# Patient Record
Sex: Female | Born: 1975 | Race: Black or African American | Hispanic: No | Marital: Single | Smoking: Never smoker
Health system: Southern US, Community
[De-identification: ages and names within clinical notes are randomized; demographics above are authoritative.]

## PROBLEM LIST (undated history)

## (undated) ENCOUNTER — Inpatient Hospital Stay (HOSPITAL_COMMUNITY): Payer: Self-pay

## (undated) DIAGNOSIS — N200 Calculus of kidney: Secondary | ICD-10-CM

## (undated) DIAGNOSIS — IMO0002 Reserved for concepts with insufficient information to code with codable children: Secondary | ICD-10-CM

## (undated) DIAGNOSIS — D649 Anemia, unspecified: Secondary | ICD-10-CM

## (undated) DIAGNOSIS — B009 Herpesviral infection, unspecified: Secondary | ICD-10-CM

## (undated) DIAGNOSIS — R87619 Unspecified abnormal cytological findings in specimens from cervix uteri: Secondary | ICD-10-CM

## (undated) DIAGNOSIS — D219 Benign neoplasm of connective and other soft tissue, unspecified: Secondary | ICD-10-CM

## (undated) DIAGNOSIS — N189 Chronic kidney disease, unspecified: Secondary | ICD-10-CM

## (undated) HISTORY — DX: Benign neoplasm of connective and other soft tissue, unspecified: D21.9

## (undated) HISTORY — PX: LAPAROSCOPY: SHX197

## (undated) HISTORY — PX: CHOLECYSTECTOMY: SHX55

## (undated) HISTORY — PX: LEEP: SHX91

## (undated) HISTORY — DX: Herpesviral infection, unspecified: B00.9

---

## 1998-11-25 ENCOUNTER — Other Ambulatory Visit: Admission: RE | Admit: 1998-11-25 | Discharge: 1998-11-25 | Payer: Self-pay | Admitting: Obstetrics and Gynecology

## 1999-05-01 ENCOUNTER — Inpatient Hospital Stay (HOSPITAL_COMMUNITY): Admission: AD | Admit: 1999-05-01 | Discharge: 1999-05-01 | Payer: Self-pay | Admitting: Obstetrics and Gynecology

## 1999-10-25 ENCOUNTER — Inpatient Hospital Stay (HOSPITAL_COMMUNITY): Admission: AD | Admit: 1999-10-25 | Discharge: 1999-10-25 | Payer: Self-pay | Admitting: Obstetrics and Gynecology

## 1999-11-11 ENCOUNTER — Other Ambulatory Visit: Admission: RE | Admit: 1999-11-11 | Discharge: 1999-11-11 | Payer: Self-pay | Admitting: Obstetrics and Gynecology

## 1999-12-30 ENCOUNTER — Other Ambulatory Visit: Admission: RE | Admit: 1999-12-30 | Discharge: 1999-12-30 | Payer: Self-pay | Admitting: Obstetrics and Gynecology

## 1999-12-30 ENCOUNTER — Encounter (INDEPENDENT_AMBULATORY_CARE_PROVIDER_SITE_OTHER): Payer: Self-pay

## 2000-03-21 ENCOUNTER — Inpatient Hospital Stay (HOSPITAL_COMMUNITY): Admission: AD | Admit: 2000-03-21 | Discharge: 2000-03-21 | Payer: Self-pay | Admitting: Obstetrics and Gynecology

## 2000-04-02 ENCOUNTER — Other Ambulatory Visit: Admission: RE | Admit: 2000-04-02 | Discharge: 2000-04-02 | Payer: Self-pay | Admitting: Obstetrics and Gynecology

## 2000-05-08 ENCOUNTER — Inpatient Hospital Stay (HOSPITAL_COMMUNITY): Admission: AD | Admit: 2000-05-08 | Discharge: 2000-05-08 | Payer: Self-pay | Admitting: Obstetrics & Gynecology

## 2000-09-11 ENCOUNTER — Other Ambulatory Visit: Admission: RE | Admit: 2000-09-11 | Discharge: 2000-09-11 | Payer: Self-pay | Admitting: Obstetrics and Gynecology

## 2000-10-17 ENCOUNTER — Other Ambulatory Visit: Admission: RE | Admit: 2000-10-17 | Discharge: 2000-10-17 | Payer: Self-pay | Admitting: Obstetrics and Gynecology

## 2000-11-16 ENCOUNTER — Ambulatory Visit (HOSPITAL_COMMUNITY): Admission: RE | Admit: 2000-11-16 | Discharge: 2000-11-16 | Payer: Self-pay | Admitting: Obstetrics and Gynecology

## 2000-11-16 ENCOUNTER — Encounter (INDEPENDENT_AMBULATORY_CARE_PROVIDER_SITE_OTHER): Payer: Self-pay | Admitting: *Deleted

## 2001-01-28 ENCOUNTER — Emergency Department (HOSPITAL_COMMUNITY): Admission: EM | Admit: 2001-01-28 | Discharge: 2001-01-29 | Payer: Self-pay | Admitting: *Deleted

## 2001-01-30 ENCOUNTER — Emergency Department (HOSPITAL_COMMUNITY): Admission: EM | Admit: 2001-01-30 | Discharge: 2001-01-30 | Payer: Self-pay | Admitting: Emergency Medicine

## 2001-01-30 ENCOUNTER — Encounter: Payer: Self-pay | Admitting: Emergency Medicine

## 2001-02-07 ENCOUNTER — Encounter: Payer: Self-pay | Admitting: Surgery

## 2001-02-07 ENCOUNTER — Ambulatory Visit (HOSPITAL_COMMUNITY): Admission: RE | Admit: 2001-02-07 | Discharge: 2001-02-07 | Payer: Self-pay | Admitting: Surgery

## 2001-03-14 ENCOUNTER — Other Ambulatory Visit: Admission: RE | Admit: 2001-03-14 | Discharge: 2001-03-14 | Payer: Self-pay | Admitting: Obstetrics and Gynecology

## 2001-04-29 ENCOUNTER — Inpatient Hospital Stay (HOSPITAL_COMMUNITY): Admission: AD | Admit: 2001-04-29 | Discharge: 2001-04-29 | Payer: Self-pay | Admitting: Obstetrics & Gynecology

## 2001-08-27 ENCOUNTER — Other Ambulatory Visit: Admission: RE | Admit: 2001-08-27 | Discharge: 2001-08-27 | Payer: Self-pay | Admitting: Obstetrics and Gynecology

## 2001-11-04 ENCOUNTER — Inpatient Hospital Stay (HOSPITAL_COMMUNITY): Admission: AD | Admit: 2001-11-04 | Discharge: 2001-11-04 | Payer: Self-pay | Admitting: Obstetrics and Gynecology

## 2002-02-06 HISTORY — PX: OTHER SURGICAL HISTORY: SHX169

## 2002-02-16 ENCOUNTER — Inpatient Hospital Stay (HOSPITAL_COMMUNITY): Admission: AD | Admit: 2002-02-16 | Discharge: 2002-02-16 | Payer: Self-pay | Admitting: Obstetrics and Gynecology

## 2002-03-26 ENCOUNTER — Inpatient Hospital Stay (HOSPITAL_COMMUNITY): Admission: AD | Admit: 2002-03-26 | Discharge: 2002-03-28 | Payer: Self-pay | Admitting: Obstetrics and Gynecology

## 2002-04-15 ENCOUNTER — Encounter: Payer: Self-pay | Admitting: Emergency Medicine

## 2002-04-16 ENCOUNTER — Encounter: Payer: Self-pay | Admitting: Surgery

## 2002-04-16 ENCOUNTER — Inpatient Hospital Stay (HOSPITAL_COMMUNITY): Admission: EM | Admit: 2002-04-16 | Discharge: 2002-04-19 | Payer: Self-pay | Admitting: Emergency Medicine

## 2002-04-16 ENCOUNTER — Encounter (INDEPENDENT_AMBULATORY_CARE_PROVIDER_SITE_OTHER): Payer: Self-pay | Admitting: Specialist

## 2002-04-17 ENCOUNTER — Encounter: Payer: Self-pay | Admitting: Gastroenterology

## 2002-04-23 ENCOUNTER — Other Ambulatory Visit: Admission: RE | Admit: 2002-04-23 | Discharge: 2002-04-23 | Payer: Self-pay | Admitting: Obstetrics and Gynecology

## 2002-07-12 ENCOUNTER — Inpatient Hospital Stay (HOSPITAL_COMMUNITY): Admission: AD | Admit: 2002-07-12 | Discharge: 2002-07-12 | Payer: Self-pay | Admitting: Obstetrics and Gynecology

## 2002-07-14 ENCOUNTER — Inpatient Hospital Stay (HOSPITAL_COMMUNITY): Admission: AD | Admit: 2002-07-14 | Discharge: 2002-07-14 | Payer: Self-pay | Admitting: Obstetrics & Gynecology

## 2002-07-15 ENCOUNTER — Observation Stay (HOSPITAL_COMMUNITY): Admission: EM | Admit: 2002-07-15 | Discharge: 2002-07-16 | Payer: Self-pay | Admitting: *Deleted

## 2002-07-15 ENCOUNTER — Encounter (INDEPENDENT_AMBULATORY_CARE_PROVIDER_SITE_OTHER): Payer: Self-pay | Admitting: Specialist

## 2003-04-29 ENCOUNTER — Other Ambulatory Visit: Admission: RE | Admit: 2003-04-29 | Discharge: 2003-04-29 | Payer: Self-pay | Admitting: Obstetrics and Gynecology

## 2003-05-08 ENCOUNTER — Ambulatory Visit (HOSPITAL_COMMUNITY): Admission: RE | Admit: 2003-05-08 | Discharge: 2003-05-08 | Payer: Self-pay | Admitting: Cardiology

## 2003-10-31 ENCOUNTER — Emergency Department (HOSPITAL_COMMUNITY): Admission: EM | Admit: 2003-10-31 | Discharge: 2003-10-31 | Payer: Self-pay | Admitting: Emergency Medicine

## 2004-05-23 ENCOUNTER — Other Ambulatory Visit: Admission: RE | Admit: 2004-05-23 | Discharge: 2004-05-23 | Payer: Self-pay | Admitting: Obstetrics and Gynecology

## 2005-07-19 ENCOUNTER — Emergency Department (HOSPITAL_COMMUNITY): Admission: EM | Admit: 2005-07-19 | Discharge: 2005-07-19 | Payer: Self-pay | Admitting: Family Medicine

## 2006-02-02 ENCOUNTER — Emergency Department (HOSPITAL_COMMUNITY): Admission: EM | Admit: 2006-02-02 | Discharge: 2006-02-02 | Payer: Self-pay | Admitting: Family Medicine

## 2006-04-28 ENCOUNTER — Emergency Department (HOSPITAL_COMMUNITY): Admission: EM | Admit: 2006-04-28 | Discharge: 2006-04-28 | Payer: Self-pay | Admitting: Family Medicine

## 2006-08-01 ENCOUNTER — Ambulatory Visit (HOSPITAL_COMMUNITY): Admission: RE | Admit: 2006-08-01 | Discharge: 2006-08-01 | Payer: Self-pay | Admitting: Obstetrics and Gynecology

## 2006-08-01 ENCOUNTER — Encounter (INDEPENDENT_AMBULATORY_CARE_PROVIDER_SITE_OTHER): Payer: Self-pay | Admitting: Obstetrics and Gynecology

## 2007-02-08 ENCOUNTER — Emergency Department (HOSPITAL_COMMUNITY): Admission: EM | Admit: 2007-02-08 | Discharge: 2007-02-08 | Payer: Self-pay | Admitting: Emergency Medicine

## 2008-05-31 ENCOUNTER — Emergency Department (HOSPITAL_COMMUNITY): Admission: EM | Admit: 2008-05-31 | Discharge: 2008-05-31 | Payer: Self-pay | Admitting: Family Medicine

## 2008-09-10 ENCOUNTER — Emergency Department (HOSPITAL_COMMUNITY): Admission: EM | Admit: 2008-09-10 | Discharge: 2008-09-10 | Payer: Self-pay | Admitting: Emergency Medicine

## 2009-08-04 ENCOUNTER — Emergency Department (HOSPITAL_COMMUNITY): Admission: EM | Admit: 2009-08-04 | Discharge: 2009-08-04 | Payer: Self-pay | Admitting: Emergency Medicine

## 2009-08-30 ENCOUNTER — Encounter: Admission: RE | Admit: 2009-08-30 | Discharge: 2009-11-04 | Payer: Self-pay | Admitting: Family Medicine

## 2009-08-31 ENCOUNTER — Emergency Department (HOSPITAL_COMMUNITY): Admission: EM | Admit: 2009-08-31 | Discharge: 2009-08-31 | Payer: Self-pay | Admitting: Emergency Medicine

## 2010-02-05 ENCOUNTER — Inpatient Hospital Stay (HOSPITAL_COMMUNITY)
Admission: AD | Admit: 2010-02-05 | Discharge: 2010-02-05 | Payer: Self-pay | Source: Home / Self Care | Attending: Obstetrics and Gynecology | Admitting: Obstetrics and Gynecology

## 2010-04-18 LAB — URINE CULTURE
Colony Count: 30000
Culture  Setup Time: 201112311144

## 2010-04-18 LAB — WET PREP, GENITAL
Clue Cells Wet Prep HPF POC: NONE SEEN
Trich, Wet Prep: NONE SEEN
Yeast Wet Prep HPF POC: NONE SEEN

## 2010-04-18 LAB — URINE MICROSCOPIC-ADD ON

## 2010-04-18 LAB — GC/CHLAMYDIA PROBE AMP, GENITAL
Chlamydia, DNA Probe: NEGATIVE
GC Probe Amp, Genital: NEGATIVE

## 2010-04-18 LAB — URINALYSIS, ROUTINE W REFLEX MICROSCOPIC
Bilirubin Urine: NEGATIVE
Glucose, UA: NEGATIVE mg/dL
Ketones, ur: NEGATIVE mg/dL
Nitrite: NEGATIVE
Protein, ur: NEGATIVE mg/dL
Specific Gravity, Urine: 1.02 (ref 1.005–1.030)
Urobilinogen, UA: 0.2 mg/dL (ref 0.0–1.0)
pH: 5.5 (ref 5.0–8.0)

## 2010-04-18 LAB — POCT PREGNANCY, URINE: Preg Test, Ur: NEGATIVE

## 2010-04-23 LAB — CBC
HCT: 35.1 % — ABNORMAL LOW (ref 36.0–46.0)
Hemoglobin: 12.1 g/dL (ref 12.0–15.0)
MCH: 30 pg (ref 26.0–34.0)
MCHC: 34.3 g/dL (ref 30.0–36.0)
MCV: 87.6 fL (ref 78.0–100.0)
Platelets: 184 10*3/uL (ref 150–400)
RBC: 4.01 MIL/uL (ref 3.87–5.11)
RDW: 13.7 % (ref 11.5–15.5)
WBC: 6 10*3/uL (ref 4.0–10.5)

## 2010-04-23 LAB — URINALYSIS, ROUTINE W REFLEX MICROSCOPIC
Bilirubin Urine: NEGATIVE
Glucose, UA: NEGATIVE mg/dL
Leukocytes, UA: NEGATIVE
Nitrite: NEGATIVE
Protein, ur: NEGATIVE mg/dL
Specific Gravity, Urine: 1.023 (ref 1.005–1.030)
Urobilinogen, UA: 0.2 mg/dL (ref 0.0–1.0)
pH: 5.5 (ref 5.0–8.0)

## 2010-04-23 LAB — COMPREHENSIVE METABOLIC PANEL
ALT: 35 U/L (ref 0–35)
AST: 54 U/L — ABNORMAL HIGH (ref 0–37)
Albumin: 3.4 g/dL — ABNORMAL LOW (ref 3.5–5.2)
Alkaline Phosphatase: 62 U/L (ref 39–117)
BUN: 12 mg/dL (ref 6–23)
CO2: 23 mEq/L (ref 19–32)
Calcium: 9.1 mg/dL (ref 8.4–10.5)
Chloride: 107 mEq/L (ref 96–112)
Creatinine, Ser: 0.75 mg/dL (ref 0.4–1.2)
GFR calc Af Amer: >60 mL/min (ref >60)
GFR calc non Af Amer: >60 mL/min (ref >60)
Glucose, Bld: 95 mg/dL (ref 70–99)
Potassium: 6 mEq/L — ABNORMAL HIGH (ref 3.5–5.1)
Sodium: 137 mEq/L (ref 135–145)
Total Bilirubin: 1 mg/dL (ref 0.3–1.2)
Total Protein: 6.7 g/dL (ref 6.0–8.3)

## 2010-04-23 LAB — DIFFERENTIAL
Basophils Absolute: 0 10*3/uL (ref 0.0–0.1)
Basophils Relative: 1 % (ref 0–1)
Eosinophils Absolute: 0.1 10*3/uL (ref 0.0–0.7)
Eosinophils Relative: 2 % (ref 0–5)
Lymphocytes Relative: 46 % (ref 12–46)
Lymphs Abs: 2.8 10*3/uL (ref 0.7–4.0)
Monocytes Absolute: 0.4 10*3/uL (ref 0.1–1.0)
Monocytes Relative: 7 % (ref 3–12)
Neutro Abs: 2.7 10*3/uL (ref 1.7–7.7)
Neutrophils Relative %: 45 % (ref 43–77)

## 2010-04-23 LAB — URINE MICROSCOPIC-ADD ON

## 2010-04-23 LAB — GLUCOSE, CAPILLARY
Glucose-Capillary: 105 mg/dL — ABNORMAL HIGH (ref 70–99)
Glucose-Capillary: 77 mg/dL (ref 70–99)

## 2010-04-23 LAB — POCT PREGNANCY, URINE: Preg Test, Ur: NEGATIVE

## 2010-04-24 LAB — BASIC METABOLIC PANEL
BUN: 12 mg/dL (ref 6–23)
CO2: 24 mEq/L (ref 19–32)
Calcium: 8.9 mg/dL (ref 8.4–10.5)
Chloride: 105 mEq/L (ref 96–112)
Creatinine, Ser: 0.84 mg/dL (ref 0.4–1.2)
GFR calc Af Amer: >60 mL/min (ref >60)
GFR calc non Af Amer: >60 mL/min (ref >60)
Glucose, Bld: 339 mg/dL — ABNORMAL HIGH (ref 70–99)
Potassium: 4 mEq/L (ref 3.5–5.1)
Sodium: 138 mEq/L (ref 135–145)

## 2010-04-24 LAB — DIFFERENTIAL
Basophils Absolute: 0 10*3/uL (ref 0.0–0.1)
Basophils Relative: 0 % (ref 0–1)
Eosinophils Absolute: 0.1 10*3/uL (ref 0.0–0.7)
Eosinophils Relative: 2 % (ref 0–5)
Lymphocytes Relative: 46 % (ref 12–46)
Lymphs Abs: 2.2 10*3/uL (ref 0.7–4.0)
Monocytes Absolute: 0.3 10*3/uL (ref 0.1–1.0)
Monocytes Relative: 5 % (ref 3–12)
Neutro Abs: 2.3 10*3/uL (ref 1.7–7.7)
Neutrophils Relative %: 47 % (ref 43–77)

## 2010-04-24 LAB — URINALYSIS, ROUTINE W REFLEX MICROSCOPIC
Bilirubin Urine: NEGATIVE
Glucose, UA: >1000 mg/dL — AB
Hgb urine dipstick: NEGATIVE
Ketones, ur: 40 mg/dL — AB
Leukocytes, UA: NEGATIVE
Nitrite: NEGATIVE
Protein, ur: NEGATIVE mg/dL
Specific Gravity, Urine: >1.046 — ABNORMAL HIGH (ref 1.005–1.030)
Urobilinogen, UA: 0.2 mg/dL (ref 0.0–1.0)
pH: 5.5 (ref 5.0–8.0)

## 2010-04-24 LAB — URINE MICROSCOPIC-ADD ON

## 2010-04-24 LAB — CBC
HCT: 40.5 % (ref 36.0–46.0)
Hemoglobin: 14.1 g/dL (ref 12.0–15.0)
MCH: 30 pg (ref 26.0–34.0)
MCHC: 34.8 g/dL (ref 30.0–36.0)
MCV: 86 fL (ref 78.0–100.0)
Platelets: 191 10*3/uL (ref 150–400)
RBC: 4.71 MIL/uL (ref 3.87–5.11)
RDW: 13.4 % (ref 11.5–15.5)
WBC: 4.9 10*3/uL (ref 4.0–10.5)

## 2010-04-24 LAB — GLUCOSE, CAPILLARY
Glucose-Capillary: 243 mg/dL — ABNORMAL HIGH (ref 70–99)
Glucose-Capillary: 269 mg/dL — ABNORMAL HIGH (ref 70–99)
Glucose-Capillary: 329 mg/dL — ABNORMAL HIGH (ref 70–99)

## 2010-05-18 LAB — URINE CULTURE: Colony Count: NO GROWTH

## 2010-05-18 LAB — POCT URINALYSIS DIP (DEVICE)
Glucose, UA: NEGATIVE mg/dL
Specific Gravity, Urine: 1.015 (ref 1.005–1.030)
Urobilinogen, UA: 0.2 mg/dL (ref 0.0–1.0)
pH: 7.5 (ref 5.0–8.0)

## 2010-05-18 LAB — HERPES SIMPLEX VIRUS CULTURE

## 2010-06-21 NOTE — Op Note (Signed)
Paige Leon, Paige Leon                ACCOUNT NO.:  [id number]   MEDICAL RECORD NO.:  [id number]          PATIENT TYPE:  AMB   LOCATION:  SDC                           FACILITY:  [facility]   PHYSICIAN:  Michelle L. Grewal, M.D.DATE OF BIRTH:  08-09-1975   DATE OF PROCEDURE:  08/01/2006  DATE OF DISCHARGE:                               OPERATIVE REPORT   PREOPERATIVE DIAGNOSIS:  Left ovarian cyst and left lower quadrant pain.   POSTOPERATIVE DIAGNOSIS:  Left ovarian cyst and left lower quadrant  pain, and left ovarian dermoid.   PROCEDURE:  Laparoscopy and drainage of left ovarian cyst and left  ovarian cystectomy.   SURGEON:  Dr. Vincente Poli.   ANESTHESIA:  General.   SPECIMENS:  Ovarian cyst.   ESTIMATED BLOOD LOSS:  Minimal.   COMPLICATIONS:  None.   PROCEDURE:  Patient was taken to the operating room where she was  intubated.  She was prepped and draped in usual sterile fashion.  An in-  and-out catheter was used to empty the bladder.  Uterine manipulator was  inserted.  A small infraumbilical incision was made with a scalpel, and  the Veress needle was inserted, and pneumoperitoneum was performed.  The  Veress needle was removed, and an 11-mm trocar was inserted.  The  patient was placed in gentle Trendelenburg position.  Exam of the  abdomen and pelvis revealed that she had a surgically absent  gallbladder.  Her uterus appeared to be normal.  No evidence of  endometriosis.  No adhesions were seen.  Right tube and ovary were  normal.  The left ovary was a markedly enlarged and appeared to be  mobile but was sitting in the cul-de-sac.  It looked like she had two  cysts adjacent to one another within the left ovary.  One appeared to be  stable, but then there was a question if the second one might be a  dermoid given I could see sebaceous material through the wall of the  ovary.  I took scissors and opened up one of the smaller cysts and  drained serous fluid.  The second larger I  drained, and immediately  copious amount of sebaceous cyst material and hair was extruded from the  cyst.  This was consistent with an ovarian dermoid.  I then opened this  up even more, and then I was able to use hydrodissection to remove the  cyst completely.  This was done with excellent hemostasis.  An Endopouch  was inserted, and the left ovarian dermoid was removed through the  Endopouch.  We did copious irrigation of the pelvis and upper abdomen,  and I did look in the right upper quadrant to ensure that there was no  sebaceous material trapped under the diaphragm and around the liver.  At  this point, we then copiously irrigated the peritoneal surfaces and  bowel surfaces, uterus and ovaries with Adept adhesion prevention  material.  We then as per the manufacturer's  recommendations left approximately 500 mL within the pelvis to hopefully  prevent adhesion formation later on.  The trocars were removed.  The  skin was closed with Dermabond skin adhesive.  All sponge, lap and  instrument counts were correct x2.  The patient was extubated and went  to recovery room in stable condition.      Michelle L. Vincente Poli, M.D.  Electronically Signed     MLG/MEDQ  D:  08/01/2006  T:  08/01/2006  Job:  [id number]

## 2010-06-24 NOTE — Consult Note (Signed)
   NAME:  Paige Leon, Paige Leon                          ACCOUNT NO.:  [id number]   MEDICAL RECORD NO.:  [id number]                   PATIENT TYPE:  INP   LOCATION:  5703                                 FACILITY:  [facility]   PHYSICIAN:  Petra Kuba, M.D.                 DATE OF BIRTH:  April 28, 1975   DATE OF CONSULTATION:  04/16/2002  DATE OF DISCHARGE:                                   CONSULTATION   HISTORY OF PRESENT ILLNESS:  The patient is seen at the request of Dr. Wenda Low for an abdominal intraoperative cholangiogram.  She underwent a  laparoscopic cholecystectomy today for symptomatic cholelithiasis and,  unfortunately, her intraoperative cholangiogram did not drain, and on some  of the pictures it looks like there is probable a distal stone.  Currently  the patient is feeling better, is having some discomfort like the previous  gallbladder pain.  She has had no other previous GI issues or problems.   PAST MEDICAL HISTORY:  Essentially negative.  She did just have a normal  vaginal delivery two weeks ago.  She stopped breast feeding for this  operation.   FAMILY HISTORY:  Negative for any obvious GI problems.   SOCIAL HISTORY:  She does not smoke or drink.   HOME MEDICATIONS:  Included some oxycodone and some Motrin which she really  has not been taking much of.   SOCIAL HISTORY:  Does not smoke or drink, minimizes other over-the-counter  medicines.   ALLERGIES:  None.   REVIEW OF SYSTEMS:  Negative except as above.   PHYSICAL EXAMINATION:  GENERAL:  No acute distress, lying comfortably on the  bed.  The patient was not examined today, will be tomorrow or prior to ERCP.   LABORATORY DATA:  Ultrasound report, intraoperative cholangiogram reviewed.   Labs today pertinent for a slight increase in her transaminase.  Normal  preop.   ASSESSMENT:  Abnormal intraoperative cholangiogram.    PLAN:  In reviewing the pictures, there does seem to be a stone on a few  views, but  I think if liver tests were normal tomorrow and she was feeling  better, could possibly follow her symptomatically.  Risks, benefits, and  methods of ERCP were discussed and happy to proceed p.r.n.  Will follow with  you.   Thank you very much.                                               Petra Kuba, M.D.    MEM/MEDQ  D:  04/16/2002  T:  04/17/2002  Job:  [id number]   cc:   [place] Daphine Deutscher, M.D.  [address], [address]  [place]  [place] [address]  Fax: [phone]

## 2010-06-24 NOTE — Op Note (Signed)
NAMEDORENA, DORFMAN                          ACCOUNT NO.:  [id number]   MEDICAL RECORD NO.:  [id number]                   PATIENT TYPE:  OBV   LOCATION:  0347                                 FACILITY:  [facility]   PHYSICIAN:  Anselm Pancoast. Zachery Dakins, M.D.          DATE OF BIRTH:  1975-03-09   DATE OF PROCEDURE:  07/15/2002  DATE OF DISCHARGE:                                 OPERATIVE REPORT   PREOPERATIVE DIAGNOSIS:  Chronic abscess, left breast, approximately four  months postpartum.   OPERATION:  Excision, debridement of chronic abscess, left breast, large.   ANESTHESIA:  General.   SURGEON:  Anselm Pancoast. Zachery Dakins, M.D.   HISTORY:  Ms. Eberwein is a 35 year old, black female, who was referred over  from [facility] today where she was seen because of a chronic/acute  left breast abscess.  The patient states that she delivered February 18, was  nursing for approximately a little over a month.  She had problems with her  gallbladder and had a laparoscopic cholecystectomy about the end of March  and no longer nursed.  She stopped having milk formation from her breast.  It appeared to be doing satisfactory but recently has started having  increasing left breast pain.  She was seen I think 2-3 days ago and placed  on Augmentin b.i.d. and then because of the increasing pain and purulent  drainage that had spontaneously occurred approximately 3 o'clock of the left  breast, presented back to the emergency room today.  I was called and asked  that she be transferred over here so we could surgically manage this  problem.  She was given 1 g of Kefzol and lab studies done at [facility] showed  a normal white count of 6800.  She has a marked, inflamed, edematous left  breast with kind of purulent drainage.  I recommended that we incise this  and whether this is just a chronic abscess or possibly some variation of  fibrocystic disease, I am not sure.  Obviously, it needs to be surgically  managed.  She was given 1 g of Kefzol immediately before going to the  operating suite.   DESCRIPTION OF PROCEDURE:  Induction of general anesthesia, endotracheal  tube.  The left breast was prepped with Betadine surgical scrub and solution  and draped in a sterile manner.  I elected to make kind of a curved  incision, circle the area where it was eroded through the skin, and made an  ellipse, probably a two inch strip of skin and about six inches probably in  total length.  I then dissected medially, and this chronic  abscess/fibrocystic changes is probably 1 cm thick.  The inner area is frank  purulence and old fat debris and when we opened into this, I cultured for  both aerobic and anaerobic.  It did not have any odor.  I then basically  removed this whole chronic abscess and  its fairly generous tissue, and we  will send it for pathology exam.  I suspect it is all inflammatory or  possibly fibrocystic disease.  There was a little thickening going up to the  areolar but not really right at the areolar edges, and the bleeders were  easily controlled with cautery or sutured with 4-0 Vicryl.  The area, after  everything had been completely excised, hemostasis was good and thoroughly  irrigated.  We then packed the cavity with two two-inch vaginal packing that  had been soaked in saline and a little dilute Betadine solution.  The  dressing will need to be changed in approximately 12-24 hours, and then we  will do warm soaks as an outpatient and whether we will do a  delayed closure or whether it will come together spontaneously will be  determined over the next few days.  I await the results of the culture and  will keep her on the Keflex at this time.  She is on 24 hour ________.  Talked with the patient's mother, who will assist in the dressing changes.                                               Anselm Pancoast. Zachery Dakins, M.D.    WJW/MEDQ  D:  07/15/2002  T:  07/15/2002  Job:   [id number]   cc:   Marcelino Duster L. Vincente Poli, M.D.  [address], [address] [place]  [place] [address]  Fax: [phone]

## 2010-06-24 NOTE — H&P (Signed)
NAME:  Paige Leon, Paige Leon                          ACCOUNT NO.:  [id number]   MEDICAL RECORD NO.:  [id number]                   PATIENT TYPE:  INP   LOCATION:  5703                                 FACILITY:  [facility]   PHYSICIAN:  Gabrielle Dare. Janee Morn, M.D.             DATE OF BIRTH:  July 08, 1975   DATE OF ADMISSION:  04/16/2002  DATE OF DISCHARGE:                                HISTORY & PHYSICAL   CHIEF COMPLAINT:  Right upper quadrant pain.   HISTORY OF PRESENT ILLNESS:  The patient is a 35 year old African-American  female who is known to me by Dr. Cicero Duck in our group who he has been  following her for symptomatic cholelithiasis.  She had been pregnant and  cholecystectomy had been postponed secondary to this.  Two weeks ago she  delivered her baby via spontaneous vaginal delivery and earlier today she  developed some recurrent right upper quadrant pain after eating some cereal.  Ultrasound tonight is consistent with cholelithiasis with mobile stones.  No  gallbladder wall thickening and no pericholecystic fluid.  No sonographic  Murphy sign.  The patient still complaining of pain in the right upper  quadrant.  She did receive some pain medication earlier in the emergency  department and she is requesting to be admitted to have this problem taken  care of.   PAST MEDICAL HISTORY:  The patient denies.   PAST SURGICAL HISTORY:  None.   MEDICATIONS:  Motrin p.r.n. due to pain from her vaginal delivery.   ALLERGIES:  No known drug allergies.   REVIEW OF SYSTEMS:  GENERAL:  No complaints.  CARDIAC:  No complaints.  PULMONARY:  No complaints.  GI:  Please see the history of present illness.  MUSCULOSKELETAL:  No complaints.  GENITOURINARY:  She is still having some  mild postprandial pain in her vaginal area.  ___________.   PHYSICAL EXAMINATION:  VITAL SIGNS:  Temperature 97.6, blood pressure  126/78, pulse 85, respirations 16, saturation is 99% on room air.  GENERAL:  She is  awake, alert in no acute distress.  HEENT:  Pupils are equal and reactive to light.  NECK:  Supple.  CHEST:  Clear to auscultation bilaterally.  HEART:  Regular  rate and rhythm.  ABDOMEN:  Soft and nondistended with some mild to moderate right upper  quadrant pain on palpation.  No other masses or tenderness found.  SKIN:  Warm and dry.  EXTREMITIES:  Palpable distal pulses.   Data reviewed includes ultrasound which demonstrates multiple mobile  gallstones with no pericholecystic fluid.  No sonographic Murphy sign and no  gallbladder wall thickening.  Urinalysis reveals a moderate hemoglobin,  leukocyte esterase of large, white blood cells 21 to 50, and bacteria are  many.  White blood cell count 8.7, hemoglobin 11.5, hematocrit 35.5,  platelets 351, sodium 139, potassium 3.8, chloride 105, CO2 30, glucose 95,  BUN 8, creatinine 0.7, AST  38, ALT 22, alk phos  91, total bili 0.8.  ___________.   ASSESSMENT:  1. Symptomatic cholelithiasis.  2. Urinary tract infection.   PLAN:  The patient requests to be admitted and have this problem taken care  of.  As she is still in pain, I believe this is reasonable.  I am going to  admit her to the hospital and give her some IV fluids, IV antibiotics to  cover both her gallbladder and her urinary tract infection.  Will keep her  NPO and make plans for cholecystectomy.  I will discuss this in the morning  with Dr. Jamey Ripa as he has been following her for this problem.                                                 Gabrielle Dare Janee Morn, M.D.    BET/MEDQ  D:  04/16/2002  T:  04/16/2002  Job:  [id number]

## 2010-06-24 NOTE — Op Note (Signed)
NAME:  Paige Leon, Paige Leon                          ACCOUNT NO.:  [id number]   MEDICAL RECORD NO.:  [id number]                   PATIENT TYPE:  INP   LOCATION:  5703                                 FACILITY:  [facility]   PHYSICIAN:  Petra Kuba, M.D.                 DATE OF BIRTH:  [birth year]   DATE OF PROCEDURE:  04/17/2002  DATE OF DISCHARGE:                                 OPERATIVE REPORT   PROCEDURE PERFORMED:  Endoscopic retrograde cholangiopancreatography,  sphincterotomy and stone extraction.   ENDOSCOPIST:  Petra Kuba, M.D.   INDICATIONS FOR PROCEDURE:  Probable common bile duct stone on  intraoperative cholangiogram with continual pain and increasing liver test.  Consent was signed after the risks, benefits, methods and options were  thoroughly discussed both yesterday and today.   MEDICINES USED:  Demerol 75 mg, Versed 5 mg.   DESCRIPTION OF PROCEDURE:  Side-viewing video therapeutic duodenoscope was  inserted by indirect vision into the stomach and then through a normal  antrum, normal pylorus into a normal duodenum and a normal-appearing ampulla  was brought into view.  On the first attempt using a triple lumen  sphincterotome, we were able to get deep selective cannulation.  On initial  injection, a small stone was seen.  The JAG wire was advanced into the  intrahepatics and we went ahead and proceeded with a small to medium-sized  sphincterotomy until we got excellent biliary drainage and we were able to  get the three-quarters bowed sphincterotome easily in and out of the duct.  As we completed the sphincterotomy, the small stone popped out,  photodocumentation was obtained.  We then went ahead and exchanged the 8.5  mm balloon over the JAG wire in the customary fashion, went ahead and  proceeded with three balloon pull-throughs.  There was very minimal  resistance in withdrawing through the ampulla.  On the last one we proceeded  with an occlusion cholangiogram  which did not reveal any additional  findings.  There was drainage seen at the end of the procedure.  The scope  was removed.  The patient tolerated the procedure well.  There were no  obvious immediate complication.   ENDOSCOPIC DIAGNOSES:  1. Normal ampulla.  2. Small common bile duct stone on initial injection.  3. No pancreatic duct injections.  4. Stone removed with a small to medium-sized sphincterotomy.  5. Three 8.5 mm balloon pull-throughs without abnormality or significant     resistance and negative occlusion cholangiogram at the end of the     procedure.    PLAN:  Customary post ERCP orders.  The patient can probably go home  tomorrow.  Repeat labs in the a.m. and continue antibiotics for now.  No  aspirin or nonsteroidals for two weeks.  Petra Kuba, M.D.    MEM/MEDQ  D:  04/17/2002  T:  04/18/2002  Job:  [id number]   cc:   [place] Daphine Deutscher, M.D.  [address]  [place]  [place] [address]  Fax: [phone]

## 2010-06-24 NOTE — Op Note (Signed)
[facility]  Patient:    ANY, MCNEICE Visit Number: [id number] MRN: [id number]          Service Type: DSU Location: [facility] Attending Physician:  Marcelle Overlie Dictated by:   Marcelle Overlie, M.D. Proc. Date: 11/15/00 Admit Date:  11/16/2000                             Operative Report  PREOPERATIVE DIAGNOSIS:       Cervical intraepithelial neoplasia-3.  POSTOPERATIVE DIAGNOSIS:      Cervical intraepithelial neoplasia-3.  PROCEDURE:                    Loop electrosurgical excision procedure cone                               biopsy of the cervix.  SURGEON:                      Marcelle Overlie, M.D.  ANESTHESIA:                   MAC with paracervical block.  DESCRIPTION OF PROCEDURE:     The patient was taken to the operating room. She was then given sedation. She was then placed in the lithotomy position. The vagina and cervix were prepped and draped in the usual sterile fashion. A speculum was placed in the vagina and the cervix was identified, and a paracervical block was performed at 5 and 7 oclock. Lugols solution was applied to the cervix and a lesion was identified involving the posterior ectocervix and then using electrocautery, using the cutting mode at 50 watts, the LEEP cone biopsy was performed retrieving two specimens of ectocervix and endocervix. A roller ball was then applied using electrocautery at 50 watts and hemostasis was then achieved at the cone bed. Monsels solution was then applied. There was good hemostasis at the end of the procedure. All sponge lap and instrument counts were correct x 2. The patient tolerated the procedure well and went to recovery room in stable condition. Dictated by:   Marcelle Overlie, M.D. Attending Physician:  Marcelle Overlie DD:  11/16/00 TD:  11/17/00 Job: [id number]

## 2010-06-24 NOTE — Discharge Summary (Signed)
NAME:  Paige Leon, Paige Leon                          ACCOUNT NO.:  [id number]   MEDICAL RECORD NO.:  [id number]                   PATIENT TYPE:  INP   LOCATION:  5703                                 FACILITY:  [facility]   PHYSICIAN:  Gabrielle Dare. Janee Morn, M.D.             DATE OF BIRTH:  June 05, 1975   DATE OF ADMISSION:  04/16/2002  DATE OF DISCHARGE:  04/19/2002                                 DISCHARGE SUMMARY   DISCHARGE DIAGNOSES:  1. Status post laparoscopic cholecystectomy.  2. Status post endoscopic retrograde cholangiopancreatography with     sphincterotomy and common bile duct stone extraction.  3. Urinary tract infection.   HISTORY OF PRESENT ILLNESS:  Patient is a 35 year old African-American  female who is known to Dr. Cicero Duck in our practice who is being  followed for symptomatic cholelithiasis.  She had been pregnant at the time,  and Dr. Jamey Ripa was waiting until her postpartum period to perform her  elective cholecystectomy, but she presented to the emergency department with  recurrent right upper quadrant pain.  Ultrasound demonstrated  cholelithiasis.  Further workup was consistent with a urinary tract  infection.  She was admitted to the hospital.   HOSPITAL COURSE:  Patient was admitted, given IV hydration, intravenous  antibiotics, and kept n.p.o.  Subsequently, in the evening of the day of  admission, she underwent laparoscopic cholecystectomy with interoperative  cholangiogram.  At that time, the cholangiogram demonstrated a common bile  duct filling defect near the ampulla, and this was felt to be consistent  with an obstructing common bile duct stone.  Postoperatively, her liver  function tests indeed rose significantly, and Dr. Vida Rigger was consulted;  he performed an ERCP with a sphincterotomy and stone extraction, which the  patient tolerated well.  Subsequently, her LFTs trended towards normal with  a total bili of 0.8.  She tolerated gradual advancement of  her diet,  remained afebrile and hemodynamically stable, and was discharged home on  postoperative day three, on 04/19/2002.   DISCHARGE DIET:  Low fat.   DISCHARGE ACTIVITY:  No heavy lifting for six weeks.    FOLLOW UP:  Patient is going to follow up with Dr. Daphine Deutscher as well as Dr.  Vida Rigger.   DISCHARGE MEDICATIONS:  Include Tylenol for pain, as the patient is still  breastfeeding.                                               Gabrielle Dare Janee Morn, M.D.    BET/MEDQ  D:  05/27/2002  T:  05/28/2002  Job:  [id number]   cc:   [place] Daphine Deutscher, M.D.  [address], [address]  [place]  [place] [address]  Fax: [phone]   Petra Kuba, M.D.  [address], [address]  [place]  [place] [address]  Fax: [phone]

## 2010-06-24 NOTE — Op Note (Signed)
NAME:  Paige Leon, Paige Leon                          ACCOUNT NO.:  [id number]   MEDICAL RECORD NO.:  [id number]                   PATIENT TYPE:  INP   LOCATION:  5703                                 FACILITY:  [facility]   PHYSICIAN:  Thornton Park. Daphine Deutscher, M.D.             DATE OF BIRTH:  1976-01-16   DATE OF PROCEDURE:  04/16/2002  DATE OF DISCHARGE:                                 OPERATIVE REPORT   PREOPERATIVE DIAGNOSIS:  Cholecystitis.   POSTOPERATIVE DIAGNOSIS:  Cholecystitis with probable distal common bile  duct stone.   SURGEON:  Thornton Park. Daphine Deutscher, M.D.   ASSISTANT:  Angelia Mould. Derrell Lolling, M.D.   PROCEDURE:  Laparoscopic cholecystectomy, intraoperative cholangiogram.   INDICATIONS FOR PROCEDURE:  The patient is a 35 year old lady who is 2 weeks  postpartum. She presented to the ED approximately 1 a.m. today with  recurrent bout of right upper quadrant pain. After informed consent was  obtained the patient was brought to the operating room for a laparoscopic  cholecystectomy. Liver function studies preoperatively were unremarkable.   DESCRIPTION OF PROCEDURE:  The patient was taken to room 17 on the afternoon  of April 16, 2002, and given general anesthesia. The abdomen was prepped  with Betadine and draped sterilely.   A longitudinal incision was made under the umbilicus and through a small  vertical slit, the abdomen was entered using Hasson technique. The abdomen  was insufflated. The port sites were injected with Marcaine and 3 port sites  were placed in the upper abdomen.   The gallbladder was grasped, elevated. She had numerous adhesions to her  infundibulum and neck which were stripped away with the dissector. I  dissected free her cystic duct and put a clip upon the gallbladder. When I  had skeletonized her cystic duct and sized it and got back a lot of dark  black bile. A little bit of mucoid material but no obvious stones.   A Reddick catheter was inserted and a dynamic  cholangiogram was obtained.  This filled the entire biliary tree using 2 syringes full of Hypaque half-  and-half. Then 2 mg of Glucagon were given and we waited several minutes and  spot films were taken but no relaxation and no flow went into the duodenum.   The bowel was decompressed to the cystic duct. The cystic duct was triple  clipped and divided and the gallbladder was removed from the gallbladder bed  using hook electrocautery without incident. I did not enter it. Prior to  detaching I surveyed the gallbladder bed and no bleeding or bile leaks were  noted. The gallbladder was placed in a bag and brought out through the  umbilicus.   We inspected the area again and no abnormalities were noted. The umbilical  port was repaired under direct visualization with a figure-of-8 suture of 0  Vicryl. The other port sites were injected. We irrigated and everything  looked  good. The abdomen was deflated.  The skin was closed with 4-0 Vicryl with Benzoin and Steri-Strips.   The patient seemed to tolerate the procedure well. She was taken to the  recovery room in satisfactory condition. 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 A consultation with Dr. Ewing Schlein was obtained in the recovery room.                                               Thornton Park Daphine Deutscher, M.D.    MBM/MEDQ  D:  04/16/2002  T:  04/17/2002  Job:  [id number]   cc:   Petra Kuba, M.D.  [address]  [place]  [place] [address]  Fax: [phone]

## 2010-10-18 LAB — HEPATITIS B SURFACE ANTIGEN: Hepatitis B Surface Ag: NEGATIVE

## 2010-10-18 LAB — RPR: RPR: NONREACTIVE

## 2010-10-18 LAB — RUBELLA ANTIBODY, IGM: Rubella: IMMUNE

## 2010-10-18 LAB — ANTIBODY SCREEN: Antibody Screen: NEGATIVE

## 2010-10-18 LAB — HIV ANTIBODY (ROUTINE TESTING W REFLEX): HIV: NONREACTIVE

## 2010-10-26 LAB — CULTURE, ROUTINE-ABSCESS: Gram Stain: NONE SEEN

## 2010-11-01 ENCOUNTER — Other Ambulatory Visit (HOSPITAL_COMMUNITY): Payer: Self-pay | Admitting: Obstetrics and Gynecology

## 2010-11-01 DIAGNOSIS — O24919 Unspecified diabetes mellitus in pregnancy, unspecified trimester: Secondary | ICD-10-CM

## 2010-11-01 DIAGNOSIS — Z3689 Encounter for other specified antenatal screening: Secondary | ICD-10-CM

## 2010-11-09 ENCOUNTER — Other Ambulatory Visit: Payer: Self-pay

## 2010-11-23 LAB — CBC
MCHC: 33.7
RBC: 4.05
RDW: 14.4 — ABNORMAL HIGH

## 2010-11-23 LAB — PREGNANCY, URINE: Preg Test, Ur: NEGATIVE

## 2010-12-13 ENCOUNTER — Encounter (HOSPITAL_COMMUNITY): Payer: Self-pay

## 2010-12-13 ENCOUNTER — Ambulatory Visit (HOSPITAL_COMMUNITY)
Admission: RE | Admit: 2010-12-13 | Discharge: 2010-12-13 | Disposition: A | Discharge status: Home / Self Care | Payer: 59 | Source: Ambulatory Visit | Attending: Obstetrics and Gynecology | Admitting: Obstetrics and Gynecology

## 2010-12-13 DIAGNOSIS — Z1389 Encounter for screening for other disorder: Secondary | ICD-10-CM | POA: Insufficient documentation

## 2010-12-13 DIAGNOSIS — Z3689 Encounter for other specified antenatal screening: Secondary | ICD-10-CM

## 2010-12-13 DIAGNOSIS — O24919 Unspecified diabetes mellitus in pregnancy, unspecified trimester: Secondary | ICD-10-CM | POA: Insufficient documentation

## 2010-12-13 DIAGNOSIS — O358XX0 Maternal care for other (suspected) fetal abnormality and damage, not applicable or unspecified: Secondary | ICD-10-CM | POA: Insufficient documentation

## 2010-12-13 DIAGNOSIS — Z363 Encounter for antenatal screening for malformations: Secondary | ICD-10-CM | POA: Insufficient documentation

## 2010-12-13 NOTE — Progress Notes (Signed)
Ultrasound in AS/OBGYN/EPIC.  Follow up U/S scheduled 

## 2011-01-04 ENCOUNTER — Ambulatory Visit (HOSPITAL_COMMUNITY): Payer: Commercial Managed Care - PPO

## 2011-01-04 ENCOUNTER — Ambulatory Visit (HOSPITAL_COMMUNITY)
Admission: RE | Admit: 2011-01-04 | Discharge: 2011-01-04 | Disposition: A | Discharge status: Home / Self Care | Payer: 59 | Source: Ambulatory Visit | Attending: Obstetrics and Gynecology | Admitting: Obstetrics and Gynecology

## 2011-01-04 DIAGNOSIS — O09299 Supervision of pregnancy with other poor reproductive or obstetric history, unspecified trimester: Secondary | ICD-10-CM | POA: Insufficient documentation

## 2011-01-04 DIAGNOSIS — O24919 Unspecified diabetes mellitus in pregnancy, unspecified trimester: Secondary | ICD-10-CM

## 2011-01-04 DIAGNOSIS — Z3689 Encounter for other specified antenatal screening: Secondary | ICD-10-CM

## 2011-01-04 DIAGNOSIS — O98519 Other viral diseases complicating pregnancy, unspecified trimester: Secondary | ICD-10-CM | POA: Insufficient documentation

## 2011-01-04 DIAGNOSIS — A6000 Herpesviral infection of urogenital system, unspecified: Secondary | ICD-10-CM | POA: Insufficient documentation

## 2011-01-05 ENCOUNTER — Ambulatory Visit (HOSPITAL_COMMUNITY): Payer: Commercial Managed Care - PPO

## 2011-01-13 ENCOUNTER — Telehealth (HOSPITAL_COMMUNITY): Payer: Self-pay | Admitting: MS"

## 2011-01-13 NOTE — Telephone Encounter (Signed)
Paige Leon previously elected to proceed with Harmony testing, which utilizes cell free fetal DNA found in the maternal circulation. This testing revealed a low risk, less than 1 in 10,000 (0.01%) for trisomies 21, 18, and 13.  We reviewed that this test is not diagnostic for chromosome conditions, but can provide information regarding the presence or absence of extra fetal DNA for chromosomes 13, 18 and 21. Paige Leon was very happy to hear the results.   The reported detection rate for Trisomy 21, and Trisomy 18 is greater than 99%, and is approximately 91% for Trisomy 13. The false positive rate is thought to be less than 1% for any of these conditions.

## 2011-01-18 ENCOUNTER — Other Ambulatory Visit: Payer: Self-pay

## 2011-02-07 NOTE — L&D Delivery Note (Signed)
Delivery Note At 11:10 AM a viable unspecified sex was delivered via Vaginal, Spontaneous Delivery (Presentation: Right Occiput Anterior).  APGAR: ,9 9  weight pending  Placenta status: Intact, Spontaneous.  Cord: 3 vessels with the following complications: Loose nuchal cord x 1  Cord pH: pending  Anesthesia: Epidural  Episiotomy: none Lacerations: sidewall Suture Repair: 3.0 chromic Est. Blood Loss (mL): 300  Mom to postpartum.  Baby to nursery-stable  For BTL at 1 pm Patient counselled about the risks of the procedure  Risk of failure rate quoted.  Genesia Caslin L 05/03/2011, 11:19 AM

## 2011-04-15 ENCOUNTER — Encounter (HOSPITAL_COMMUNITY): Payer: Self-pay | Admitting: *Deleted

## 2011-04-15 ENCOUNTER — Inpatient Hospital Stay (HOSPITAL_COMMUNITY)
Admission: AD | Admit: 2011-04-15 | Discharge: 2011-04-15 | Disposition: A | Discharge status: Home / Self Care | Payer: 59 | Source: Ambulatory Visit | Attending: Obstetrics and Gynecology | Admitting: Obstetrics and Gynecology

## 2011-04-15 DIAGNOSIS — O4700 False labor before 37 completed weeks of gestation, unspecified trimester: Secondary | ICD-10-CM

## 2011-04-15 HISTORY — DX: Calculus of kidney: N20.0

## 2011-04-15 HISTORY — DX: Chronic kidney disease, unspecified: N18.9

## 2011-04-15 HISTORY — DX: Unspecified abnormal cytological findings in specimens from cervix uteri: R87.619

## 2011-04-15 HISTORY — DX: Anemia, unspecified: D64.9

## 2011-04-15 HISTORY — DX: Reserved for concepts with insufficient information to code with codable children: IMO0002

## 2011-04-15 LAB — URINALYSIS, ROUTINE W REFLEX MICROSCOPIC
Ketones, ur: 15 mg/dL — AB
Nitrite: NEGATIVE
Protein, ur: NEGATIVE mg/dL
Urobilinogen, UA: 0.2 mg/dL (ref 0.0–1.0)

## 2011-04-15 LAB — WET PREP, GENITAL
Clue Cells Wet Prep HPF POC: NONE SEEN
Trich, Wet Prep: NONE SEEN

## 2011-04-15 MED ORDER — PROMETHAZINE HCL 25 MG/ML IJ SOLN
12.5000 mg | Freq: Once | INTRAMUSCULAR | Status: AC
  Administered 2011-04-15: 12.5 mg via INTRAVENOUS
  Filled 2011-04-15: qty 1

## 2011-04-15 MED ORDER — BUTORPHANOL TARTRATE 2 MG/ML IJ SOLN
2.0000 mg | Freq: Once | INTRAMUSCULAR | Status: AC
  Administered 2011-04-15: 2 mg via INTRAVENOUS
  Filled 2011-04-15: qty 1

## 2011-04-15 MED ORDER — LACTATED RINGERS IV BOLUS (SEPSIS)
1000.0000 mL | Freq: Once | INTRAVENOUS | Status: AC
  Administered 2011-04-15: 1000 mL via INTRAVENOUS

## 2011-04-15 MED ORDER — TERBUTALINE SULFATE 1 MG/ML IJ SOLN
0.2500 mg | Freq: Once | INTRAMUSCULAR | Status: AC
  Administered 2011-04-15: 0.25 mg via SUBCUTANEOUS
  Filled 2011-04-15: qty 1

## 2011-04-15 NOTE — ED Notes (Signed)
Paige Leon CNM in with pt

## 2011-04-15 NOTE — Progress Notes (Signed)
Pt states, " I started having cramping in my pelvis with lots of pressure last night at 8 pm. I tried drinking water and laying on my left side, but it didn't help. It went on all night and today. I'm real nauseated, and feel like I'm coming on my period."

## 2011-04-15 NOTE — ED Provider Notes (Signed)
NICO ROGNESS is a 36 y.o. year old G20P1011 female at [redacted]w[redacted]d weeks gestation who presents to [facility] reporting preterm contractions since 2000 since last night. She denies vaginal bleeding or leaking of fluid and reports pos FM.   Maternal Medical History:  Reason for admission: Reason for Admission:   nausea  OB History    Grav Para Term Preterm Abortions TAB SAB Ect Mult Living   3 1 1  0 1 1 0 0 0 1     Past Medical History  Diagnosis Date  . Diabetes mellitus   . Chronic kidney disease   . Kidney stone   . Abnormal Pap smear   . Anemia    Past Surgical History  Procedure Date  . Cholecystectomy   . I&d left breast 2004   Family History: family history is negative for Anesthesia problems, and Hypotension, and Malignant hyperthermia, and Pseudochol deficiency, . Social History:  does not have a smoking history on file. She has never used smokeless tobacco. She reports that she does not drink alcohol or use illicit drugs.  Review of Systems  Constitutional: Negative for fever and chills.  Gastrointestinal: Positive for abdominal pain. Negative for nausea, vomiting, diarrhea and constipation.  Genitourinary: Negative for dysuria, urgency, frequency, hematuria and flank pain.  Musculoskeletal: Positive for back pain (LBP w/ UC's).      Blood pressure 127/70, pulse 97, temperature 99.7 F (37.6 C), temperature source Oral, resp. rate 20, height 5\' 5"  (1.651 m), weight 89.812 kg (198 lb), last menstrual period 08/14/2010. Maternal Exam:  Uterine Assessment: Contraction strength is moderate.  Contraction duration is 60 seconds. Contraction frequency is rare and regular.   Abdomen: Patient reports no abdominal tenderness. Fundal height is S=D.    Introitus: Normal vulva. Vagina is positive for vaginal discharge.  Pelvis: adequate for delivery.   Cervix: Cervix evaluated by sterile speculum exam and digital exam.     Fetal Exam Fetal Monitor Review: Mode: ultrasound.   Baseline  rate: 140.  Variability: moderate (6-25 bpm).   Pattern: accelerations present and no decelerations.    Fetal State Assessment: Category I - tracings are normal.     Physical Exam  Constitutional: She appears well-developed and well-nourished. She appears distressed (moderate).  Genitourinary: Vaginal discharge found.    Prenatal labs: ABO, Rh:   Antibody:   Rubella:   RPR:    HBsAg:    HIV:    GBS:     Results for orders placed during the hospital encounter of 04/15/11 (from the past 24 hour(s))  URINALYSIS, ROUTINE W REFLEX MICROSCOPIC     Status: Abnormal   Collection Time   04/15/11  3:10 PM      Component Value Range   Color, Urine YELLOW  YELLOW    APPearance HAZY (*) CLEAR    Specific Gravity, Urine 1.015  1.005 - 1.030    pH 6.5  5.0 - 8.0    Glucose, UA NEGATIVE  NEGATIVE (mg/dL)   Hgb urine dipstick NEGATIVE  NEGATIVE    Bilirubin Urine NEGATIVE  NEGATIVE    Ketones, ur 15 (*) NEGATIVE (mg/dL)   Protein, ur NEGATIVE  NEGATIVE (mg/dL)   Urobilinogen, UA 0.2  0.0 - 1.0 (mg/dL)   Nitrite NEGATIVE  NEGATIVE    Leukocytes, UA LARGE (*) NEGATIVE   URINE MICROSCOPIC-ADD ON     Status: Abnormal   Collection Time   04/15/11  3:10 PM      Component Value Range   Squamous Epithelial /  LPF MANY (*) RARE    WBC, UA 7-10  <3 (WBC/hpf)   RBC / HPF 0-2  <3 (RBC/hpf)   Bacteria, UA FEW (*) RARE   WET PREP, GENITAL     Status: Abnormal   Collection Time   04/15/11  4:09 PM      Component Value Range   Yeast Wet Prep HPF POC NONE SEEN  NONE SEEN    Trich, Wet Prep NONE SEEN  NONE SEEN    Clue Cells Wet Prep HPF POC NONE SEEN  NONE SEEN    WBC, Wet Prep HPF POC MANY (*) NONE SEEN    ED course: Mild improvement after terb. Pain 6/10. Pt requesting pain meds. VE unchanged. Dr Rana Snare notified, ordered IV bolus, Stadol and Phenergan.  UC's irreg, mild. Pt reports significant improvement.   Assessment/Plan: Preterm contractions w/out cervical change  D/C home per consult w/  Dr. Rana Snare F/U in office as scheduled 04/17/11 or MAU PRN if Sx worsen PTL precautions and FKCs  Coran Dipaola 04/15/2011, 4:53 PM

## 2011-04-16 ENCOUNTER — Inpatient Hospital Stay (HOSPITAL_COMMUNITY)
Admission: AD | Admit: 2011-04-16 | Discharge: 2011-04-16 | Disposition: A | Discharge status: Home Health | Payer: 59 | Source: Ambulatory Visit | Attending: Obstetrics and Gynecology | Admitting: Obstetrics and Gynecology

## 2011-04-16 ENCOUNTER — Encounter (HOSPITAL_COMMUNITY): Payer: Self-pay | Admitting: *Deleted

## 2011-04-16 DIAGNOSIS — N390 Urinary tract infection, site not specified: Secondary | ICD-10-CM | POA: Insufficient documentation

## 2011-04-16 DIAGNOSIS — O4700 False labor before 37 completed weeks of gestation, unspecified trimester: Secondary | ICD-10-CM | POA: Insufficient documentation

## 2011-04-16 DIAGNOSIS — O2390 Unspecified genitourinary tract infection in pregnancy, unspecified trimester: Secondary | ICD-10-CM | POA: Insufficient documentation

## 2011-04-16 DIAGNOSIS — O24919 Unspecified diabetes mellitus in pregnancy, unspecified trimester: Secondary | ICD-10-CM

## 2011-04-16 DIAGNOSIS — O2340 Unspecified infection of urinary tract in pregnancy, unspecified trimester: Secondary | ICD-10-CM

## 2011-04-16 DIAGNOSIS — O479 False labor, unspecified: Secondary | ICD-10-CM

## 2011-04-16 LAB — URINE MICROSCOPIC-ADD ON

## 2011-04-16 LAB — URINALYSIS, ROUTINE W REFLEX MICROSCOPIC
Bilirubin Urine: NEGATIVE
Glucose, UA: NEGATIVE mg/dL
Hgb urine dipstick: NEGATIVE
Ketones, ur: NEGATIVE mg/dL
Nitrite: NEGATIVE
Protein, ur: NEGATIVE mg/dL
Specific Gravity, Urine: 1.025 (ref 1.005–1.030)
Urobilinogen, UA: 0.2 mg/dL (ref 0.0–1.0)
pH: 6 (ref 5.0–8.0)

## 2011-04-16 MED ORDER — CEFTRIAXONE SODIUM 1 G IJ SOLR: 1.0000 g | Freq: Once | INTRAMUSCULAR | Status: DC

## 2011-04-16 MED ORDER — CEFTRIAXONE SODIUM 1 G IJ SOLR
1.0000 g | Freq: Once | INTRAMUSCULAR | Status: AC
  Administered 2011-04-16: 1 g via INTRAMUSCULAR
  Filled 2011-04-16: qty 10

## 2011-04-16 MED ORDER — NIFEDIPINE 10 MG PO CAPS: 10.0000 mg | ORAL_CAPSULE | Freq: Three times a day (TID) | ORAL | Status: DC

## 2011-04-16 MED ORDER — NIFEDIPINE 10 MG PO CAPS
10.0000 mg | ORAL_CAPSULE | Freq: Once | ORAL | Status: AC
  Administered 2011-04-16: 10 mg via ORAL
  Filled 2011-04-16: qty 1

## 2011-04-16 NOTE — Progress Notes (Signed)
Pt reports ctx got stronger again this morning. Ctx q 5 min denies SROM or bleeding at this time. Reports good fetal movement.

## 2011-04-16 NOTE — ED Provider Notes (Signed)
History     CSN: [id number]  Arrival date & time 04/16/11  1032   None     Chief Complaint  Patient presents with  . Contractions    (Consider location/radiation/quality/duration/timing/severity/associated sxs/prior treatment) HPI Paige Leon is a 36 y.o. G3P1011 at [redacted]w[redacted]d. She had MAU visit yesterday for contractions, no leaking or bleeding. Good FM. Her cx was 1 cm,long.  She returns this am for same c/o. She received Terb SQ, Stadol and Phenergan last evening prior to discharge, slept till 3 am, woke with ctx every 4 min. 7/10 for pain. Glucose control has been good, A1C last week was 5.5. Denies UTI S&S or change in discharge. Past Medical History  Diagnosis Date  . Diabetes mellitus   . Chronic kidney disease   . Kidney stone   . Abnormal Pap smear   . Anemia     Past Surgical History  Procedure Date  . Cholecystectomy   . I&d left breast 2004    Family History  Problem Relation Age of Onset  . Anesthesia problems Neg Hx   . Hypotension Neg Hx   . Malignant hyperthermia Neg Hx   . Pseudochol deficiency Neg Hx     History  Substance Use Topics  . Smoking status: Not on file  . Smokeless tobacco: Never Used  . Alcohol Use: No    OB History    Grav Para Term Preterm Abortions TAB SAB Ect Mult Living   3 1 1  0 1 1 0 0 0 1      Review of Systems  Constitutional: Negative for fever and chills.  Gastrointestinal: Negative.   Genitourinary: Negative for dysuria, urgency, frequency, vaginal bleeding and vaginal discharge.       Contractions    Allergies  Review of patient's allergies indicates no known allergies.  Home Medications  No current outpatient prescriptions on file.  BP 121/69  Pulse 96  Temp(Src) 99.3 F (37.4 C) (Oral)  Resp 18  Ht 5\' 5"  (1.651 m)  Wt 201 lb 12.8 oz (91.536 kg)  BMI 33.58 kg/m2  LMP 08/14/2010  Physical Exam  Constitutional: She appears well-developed and well-nourished.  Abdominal: Soft. She exhibits mass. There  is no tenderness. There is no rebound and no guarding.       Gravid uterus 35 wk size  Genitourinary:       Cx FT, long  Musculoskeletal: Normal range of motion.  Neurological: She is alert.  Skin: Skin is warm and dry.  Psychiatric: She has a normal mood and affect. Her behavior is normal.    ED Course  Procedures (including critical care time) 11:25 am Consulted with Dr Rana Snare, contracting every 9 min, reactive strip, cx unchanged u/a with C&S, Procardia, po hydrate. 12:30 Pt feeling much better, dressed,ready to go home. No regular ctx on last monitor strip Dr Rana Snare informed of results Results for orders placed during the hospital encounter of 04/16/11 (from the past 24 hour(s))  URINALYSIS, ROUTINE W REFLEX MICROSCOPIC     Status: Abnormal   Collection Time   04/16/11  9:45 AM      Component Value Range   Color, Urine YELLOW  YELLOW    APPearance CLEAR  CLEAR    Specific Gravity, Urine 1.025  1.005 - 1.030    pH 6.0  5.0 - 8.0    Glucose, UA NEGATIVE  NEGATIVE (mg/dL)   Hgb urine dipstick NEGATIVE  NEGATIVE    Bilirubin Urine NEGATIVE  NEGATIVE  Ketones, ur NEGATIVE  NEGATIVE (mg/dL)   Protein, ur NEGATIVE  NEGATIVE (mg/dL)   Urobilinogen, UA 0.2  0.0 - 1.0 (mg/dL)   Nitrite NEGATIVE  NEGATIVE    Leukocytes, UA TRACE (*) NEGATIVE   URINE MICROSCOPIC-ADD ON     Status: Abnormal   Collection Time   04/16/11  9:45 AM      Component Value Range   Squamous Epithelial / LPF FEW (*) RARE    WBC, UA 3-6  <3 (WBC/hpf)   RBC / HPF 0-2  <3 (RBC/hpf)   Bacteria, UA MANY (*) RARE    Crystals CA OXALATE CRYSTALS (*) NEGATIVE     Rocephin 1 mg IM for UTI  Rx Procardia 10 mg Q 6-8 hr prn ctx Keep appt tomorrow with Dr Vincente Poli for PNV Precautions reviewed Note for being out of work this week end.         MDM          Rodell Perna, NP 04/16/11 1254

## 2011-04-16 NOTE — Discharge Instructions (Signed)
Call the office if your contractions return

## 2011-04-17 LAB — URINE CULTURE
Culture  Setup Time: 201303101731
Special Requests: NORMAL

## 2011-04-18 LAB — CULTURE, BETA STREP (GROUP B ONLY)

## 2011-04-18 LAB — STREP B DNA PROBE: GBS: NEGATIVE

## 2011-04-24 ENCOUNTER — Encounter (HOSPITAL_COMMUNITY): Payer: Self-pay | Admitting: *Deleted

## 2011-04-24 ENCOUNTER — Telehealth (HOSPITAL_COMMUNITY): Payer: Self-pay | Admitting: *Deleted

## 2011-04-24 NOTE — Telephone Encounter (Signed)
Preadmission screen  

## 2011-05-02 ENCOUNTER — Encounter (HOSPITAL_COMMUNITY): Payer: Self-pay

## 2011-05-02 ENCOUNTER — Inpatient Hospital Stay (HOSPITAL_COMMUNITY)
Admission: RE | Admit: 2011-05-02 | Discharge: 2011-05-05 | DRG: 767 | Disposition: A | Discharge status: Home / Self Care | Payer: 59 | Source: Ambulatory Visit | Attending: Obstetrics and Gynecology | Admitting: Obstetrics and Gynecology

## 2011-05-02 DIAGNOSIS — O2432 Unspecified pre-existing diabetes mellitus in childbirth: Principal | ICD-10-CM | POA: Diagnosis present

## 2011-05-02 DIAGNOSIS — O358XX0 Maternal care for other (suspected) fetal abnormality and damage, not applicable or unspecified: Secondary | ICD-10-CM | POA: Diagnosis present

## 2011-05-02 DIAGNOSIS — Z302 Encounter for sterilization: Secondary | ICD-10-CM

## 2011-05-02 DIAGNOSIS — E119 Type 2 diabetes mellitus without complications: Secondary | ICD-10-CM | POA: Diagnosis present

## 2011-05-02 LAB — MRSA PCR SCREENING: MRSA by PCR: NEGATIVE

## 2011-05-02 LAB — CBC
HCT: 33.8 % — ABNORMAL LOW (ref 36.0–46.0)
Hemoglobin: 11.1 g/dL — ABNORMAL LOW (ref 12.0–15.0)
MCH: 29.8 pg (ref 26.0–34.0)
MCHC: 32.8 g/dL (ref 30.0–36.0)
RDW: 14.6 % (ref 11.5–15.5)

## 2011-05-02 MED ORDER — MISOPROSTOL 25 MCG QUARTER TABLET
25.0000 ug | ORAL_TABLET | ORAL | Status: AC | PRN
  Administered 2011-05-02 – 2011-05-03 (×2): 25 ug via VAGINAL
  Filled 2011-05-02 (×2): qty 0.25

## 2011-05-02 MED ORDER — LACTATED RINGERS IV SOLN: 500.0000 mL | INTRAVENOUS | Status: DC | PRN

## 2011-05-02 MED ORDER — FLEET ENEMA 7-19 GM/118ML RE ENEM: 1.0000 | ENEMA | RECTAL | Status: DC | PRN

## 2011-05-02 MED ORDER — ONDANSETRON HCL 4 MG/2ML IJ SOLN
4.0000 mg | Freq: Four times a day (QID) | INTRAMUSCULAR | Status: DC | PRN
  Administered 2011-05-03: 4 mg via INTRAVENOUS
  Filled 2011-05-02: qty 2

## 2011-05-02 MED ORDER — OXYTOCIN BOLUS FROM INFUSION
500.0000 mL | Freq: Once | INTRAVENOUS | Status: AC
  Administered 2011-05-03: 500 mL via INTRAVENOUS
  Filled 2011-05-02: qty 1000
  Filled 2011-05-02: qty 500

## 2011-05-02 MED ORDER — TERBUTALINE SULFATE 1 MG/ML IJ SOLN: 0.2500 mg | Freq: Once | INTRAMUSCULAR | Status: AC | PRN

## 2011-05-02 MED ORDER — ZOLPIDEM TARTRATE 10 MG PO TABS
10.0000 mg | ORAL_TABLET | Freq: Every evening | ORAL | Status: DC | PRN
  Administered 2011-05-02: 10 mg via ORAL
  Filled 2011-05-02: qty 1

## 2011-05-02 MED ORDER — OXYTOCIN 20 UNITS IN LACTATED RINGERS INFUSION - SIMPLE: 125.0000 mL/h | Freq: Once | INTRAVENOUS | Status: DC

## 2011-05-02 MED ORDER — CITRIC ACID-SODIUM CITRATE 334-500 MG/5ML PO SOLN
30.0000 mL | ORAL | Status: DC | PRN
  Administered 2011-05-03: 30 mL via ORAL
  Filled 2011-05-02: qty 15

## 2011-05-02 MED ORDER — LIDOCAINE HCL (PF) 1 % IJ SOLN
30.0000 mL | INTRAMUSCULAR | Status: DC | PRN
  Filled 2011-05-02: qty 30

## 2011-05-02 MED ORDER — LACTATED RINGERS IV SOLN
INTRAVENOUS | Status: DC
  Administered 2011-05-03 (×2): via INTRAVENOUS

## 2011-05-02 MED ORDER — OXYCODONE-ACETAMINOPHEN 5-325 MG PO TABS: 1.0000 | ORAL_TABLET | ORAL | Status: DC | PRN

## 2011-05-02 MED ORDER — IBUPROFEN 600 MG PO TABS: 600.0000 mg | ORAL_TABLET | Freq: Four times a day (QID) | ORAL | Status: DC | PRN

## 2011-05-02 MED ORDER — ACETAMINOPHEN 325 MG PO TABS: 650.0000 mg | ORAL_TABLET | ORAL | Status: DC | PRN

## 2011-05-03 ENCOUNTER — Encounter (HOSPITAL_COMMUNITY): Payer: Self-pay | Admitting: Anesthesiology

## 2011-05-03 ENCOUNTER — Encounter (HOSPITAL_COMMUNITY): Payer: Self-pay

## 2011-05-03 ENCOUNTER — Inpatient Hospital Stay (HOSPITAL_COMMUNITY): Payer: 59 | Admitting: Anesthesiology

## 2011-05-03 ENCOUNTER — Encounter (HOSPITAL_COMMUNITY)
Admission: RE | Disposition: A | Discharge status: Home / Self Care | Payer: Self-pay | Source: Ambulatory Visit | Attending: Obstetrics and Gynecology

## 2011-05-03 HISTORY — PX: TUBAL LIGATION: SHX77

## 2011-05-03 LAB — GLUCOSE, CAPILLARY
Glucose-Capillary: 69 mg/dL — ABNORMAL LOW (ref 70–99)
Glucose-Capillary: 76 mg/dL (ref 70–99)
Glucose-Capillary: 103 mg/dL — ABNORMAL HIGH (ref 70–99)

## 2011-05-03 SURGERY — LIGATION, FALLOPIAN TUBE, POSTPARTUM
Anesthesia: Spinal | Site: Abdomen | Laterality: Bilateral | Wound class: Clean

## 2011-05-03 MED ORDER — IBUPROFEN 600 MG PO TABS
600.0000 mg | ORAL_TABLET | Freq: Four times a day (QID) | ORAL | Status: DC
  Administered 2011-05-03 – 2011-05-05 (×7): 600 mg via ORAL
  Filled 2011-05-03: qty 2
  Filled 2011-05-03 (×5): qty 1

## 2011-05-03 MED ORDER — BUPIVACAINE HCL (PF) 0.25 % IJ SOLN
INTRAMUSCULAR | Status: DC | PRN
  Administered 2011-05-03: 5 mL

## 2011-05-03 MED ORDER — ONDANSETRON HCL 4 MG PO TABS: 4.0000 mg | ORAL_TABLET | ORAL | Status: DC | PRN

## 2011-05-03 MED ORDER — CEFAZOLIN SODIUM-DEXTROSE 2-3 GM-% IV SOLR: 2.0000 g | INTRAVENOUS | Status: DC

## 2011-05-03 MED ORDER — LIDOCAINE HCL (PF) 1 % IJ SOLN
INTRAMUSCULAR | Status: DC | PRN
  Administered 2011-05-03 (×2): 5 mL

## 2011-05-03 MED ORDER — TETANUS-DIPHTH-ACELL PERTUSSIS 5-2.5-18.5 LF-MCG/0.5 IM SUSP: 0.5000 mL | Freq: Once | INTRAMUSCULAR | Status: DC

## 2011-05-03 MED ORDER — DIPHENHYDRAMINE HCL 25 MG PO CAPS: 25.0000 mg | ORAL_CAPSULE | Freq: Four times a day (QID) | ORAL | Status: DC | PRN

## 2011-05-03 MED ORDER — PHENYLEPHRINE 40 MCG/ML (10ML) SYRINGE FOR IV PUSH (FOR BLOOD PRESSURE SUPPORT)
80.0000 ug | PREFILLED_SYRINGE | INTRAVENOUS | Status: DC | PRN
  Filled 2011-05-03 (×2): qty 5

## 2011-05-03 MED ORDER — ONDANSETRON HCL 4 MG/2ML IJ SOLN
INTRAMUSCULAR | Status: AC
  Filled 2011-05-03: qty 2

## 2011-05-03 MED ORDER — INSULIN ASPART 100 UNIT/ML ~~LOC~~ SOLN: 0.0000 [IU] | Freq: Every day | SUBCUTANEOUS | Status: DC

## 2011-05-03 MED ORDER — WITCH HAZEL-GLYCERIN EX PADS: 1.0000 "application " | MEDICATED_PAD | CUTANEOUS | Status: DC | PRN

## 2011-05-03 MED ORDER — BENZOCAINE-MENTHOL 20-0.5 % EX AERO: 1.0000 "application " | INHALATION_SPRAY | CUTANEOUS | Status: DC | PRN

## 2011-05-03 MED ORDER — BUPIVACAINE HCL 0.75 % IJ SOLN
INTRAMUSCULAR | Status: DC | PRN
  Administered 2011-05-03: 1.5 mL

## 2011-05-03 MED ORDER — BISACODYL 10 MG RE SUPP: 10.0000 mg | Freq: Every day | RECTAL | Status: DC | PRN

## 2011-05-03 MED ORDER — OXYCODONE-ACETAMINOPHEN 5-325 MG PO TABS
1.0000 | ORAL_TABLET | ORAL | Status: DC | PRN
  Administered 2011-05-03 (×3): 1 via ORAL
  Administered 2011-05-04: 2 via ORAL
  Administered 2011-05-04: 1 via ORAL
  Administered 2011-05-04: 2 via ORAL
  Administered 2011-05-04 – 2011-05-05 (×5): 1 via ORAL
  Filled 2011-05-03 (×3): qty 1
  Filled 2011-05-03 (×2): qty 2
  Filled 2011-05-03 (×5): qty 1

## 2011-05-03 MED ORDER — ONDANSETRON HCL 4 MG/2ML IJ SOLN
INTRAMUSCULAR | Status: DC | PRN
  Administered 2011-05-03: 4 mg via INTRAVENOUS

## 2011-05-03 MED ORDER — BUPIVACAINE HCL (PF) 0.25 % IJ SOLN
INTRAMUSCULAR | Status: AC
  Filled 2011-05-03: qty 30

## 2011-05-03 MED ORDER — PHENYLEPHRINE 40 MCG/ML (10ML) SYRINGE FOR IV PUSH (FOR BLOOD PRESSURE SUPPORT): 80.0000 ug | PREFILLED_SYRINGE | INTRAVENOUS | Status: DC | PRN

## 2011-05-03 MED ORDER — FENTANYL 2.5 MCG/ML BUPIVACAINE 1/10 % EPIDURAL INFUSION (WH - ANES)
INTRAMUSCULAR | Status: DC | PRN
  Administered 2011-05-03: .1 mL/h via EPIDURAL

## 2011-05-03 MED ORDER — FENTANYL CITRATE 0.05 MG/ML IJ SOLN
25.0000 ug | INTRAMUSCULAR | Status: DC | PRN
  Administered 2011-05-03 (×2): 50 ug via INTRAVENOUS

## 2011-05-03 MED ORDER — LACTATED RINGERS IV SOLN: INTRAVENOUS | Status: DC

## 2011-05-03 MED ORDER — OXYCODONE-ACETAMINOPHEN 5-325 MG PO TABS
ORAL_TABLET | ORAL | Status: AC
  Administered 2011-05-03: 1 via ORAL
  Filled 2011-05-03: qty 1

## 2011-05-03 MED ORDER — SODIUM BICARBONATE 8.4 % IV SOLN
INTRAVENOUS | Status: DC | PRN
  Administered 2011-05-03: 3 mL via EPIDURAL

## 2011-05-03 MED ORDER — FENTANYL 2.5 MCG/ML BUPIVACAINE 1/10 % EPIDURAL INFUSION (WH - ANES)
14.0000 mL/h | INTRAMUSCULAR | Status: DC
  Administered 2011-05-03 (×2): 14 mL/h via EPIDURAL
  Filled 2011-05-03 (×2): qty 60

## 2011-05-03 MED ORDER — LANOLIN HYDROUS EX OINT: TOPICAL_OINTMENT | CUTANEOUS | Status: DC | PRN

## 2011-05-03 MED ORDER — FENTANYL CITRATE 0.05 MG/ML IJ SOLN
INTRAMUSCULAR | Status: AC
  Administered 2011-05-03: 50 ug via INTRAVENOUS
  Filled 2011-05-03: qty 2

## 2011-05-03 MED ORDER — DIPHENHYDRAMINE HCL 50 MG/ML IJ SOLN: 12.5000 mg | INTRAMUSCULAR | Status: DC | PRN

## 2011-05-03 MED ORDER — LACTATED RINGERS IV SOLN
INTRAVENOUS | Status: DC | PRN
  Administered 2011-05-03: 13:00:00 via INTRAVENOUS

## 2011-05-03 MED ORDER — PRENATAL MULTIVITAMIN CH
1.0000 | ORAL_TABLET | Freq: Every day | ORAL | Status: DC
  Administered 2011-05-03 – 2011-05-05 (×3): 1 via ORAL
  Filled 2011-05-03 (×4): qty 1

## 2011-05-03 MED ORDER — ZOLPIDEM TARTRATE 5 MG PO TABS: 5.0000 mg | ORAL_TABLET | Freq: Every evening | ORAL | Status: DC | PRN

## 2011-05-03 MED ORDER — BUTORPHANOL TARTRATE 2 MG/ML IJ SOLN
1.0000 mg | INTRAMUSCULAR | Status: DC | PRN
  Administered 2011-05-03: 1 mg via INTRAVENOUS
  Filled 2011-05-03: qty 1

## 2011-05-03 MED ORDER — SIMETHICONE 80 MG PO CHEW: 80.0000 mg | CHEWABLE_TABLET | ORAL | Status: DC | PRN

## 2011-05-03 MED ORDER — LACTATED RINGERS IV SOLN
500.0000 mL | Freq: Once | INTRAVENOUS | Status: AC
  Administered 2011-05-03: 500 mL via INTRAVENOUS

## 2011-05-03 MED ORDER — EPHEDRINE 5 MG/ML INJ: 10.0000 mg | INTRAVENOUS | Status: DC | PRN

## 2011-05-03 MED ORDER — CEFAZOLIN SODIUM 1-5 GM-% IV SOLN
INTRAVENOUS | Status: DC | PRN
  Administered 2011-05-03: 1 g via INTRAVENOUS

## 2011-05-03 MED ORDER — MEASLES, MUMPS & RUBELLA VAC ~~LOC~~ INJ
0.5000 mL | INJECTION | Freq: Once | SUBCUTANEOUS | Status: DC
  Filled 2011-05-03: qty 0.5

## 2011-05-03 MED ORDER — DEXTROSE IN LACTATED RINGERS 5 % IV SOLN
INTRAVENOUS | Status: DC | PRN
  Administered 2011-05-03: 13:00:00 via INTRAVENOUS

## 2011-05-03 MED ORDER — METFORMIN HCL 850 MG PO TABS
850.0000 mg | ORAL_TABLET | Freq: Two times a day (BID) | ORAL | Status: DC
  Administered 2011-05-03 – 2011-05-05 (×4): 850 mg via ORAL
  Filled 2011-05-03 (×4): qty 1

## 2011-05-03 MED ORDER — FLEET ENEMA 7-19 GM/118ML RE ENEM: 1.0000 | ENEMA | Freq: Every day | RECTAL | Status: DC | PRN

## 2011-05-03 MED ORDER — METOCLOPRAMIDE HCL 5 MG/ML IJ SOLN: 10.0000 mg | Freq: Once | INTRAMUSCULAR | Status: DC | PRN

## 2011-05-03 MED ORDER — EPHEDRINE 5 MG/ML INJ
10.0000 mg | INTRAVENOUS | Status: DC | PRN
  Filled 2011-05-03 (×2): qty 4

## 2011-05-03 MED ORDER — CEFAZOLIN SODIUM 1-5 GM-% IV SOLN
INTRAVENOUS | Status: AC
  Filled 2011-05-03: qty 50

## 2011-05-03 MED ORDER — MEPERIDINE HCL 25 MG/ML IJ SOLN: 6.2500 mg | INTRAMUSCULAR | Status: DC | PRN

## 2011-05-03 MED ORDER — ONDANSETRON HCL 4 MG/2ML IJ SOLN: 4.0000 mg | INTRAMUSCULAR | Status: DC | PRN

## 2011-05-03 MED ORDER — MEDROXYPROGESTERONE ACETATE 150 MG/ML IM SUSP: 150.0000 mg | INTRAMUSCULAR | Status: DC | PRN

## 2011-05-03 MED ORDER — INSULIN ASPART 100 UNIT/ML ~~LOC~~ SOLN: 0.0000 [IU] | Freq: Three times a day (TID) | SUBCUTANEOUS | Status: DC

## 2011-05-03 MED ORDER — SENNOSIDES-DOCUSATE SODIUM 8.6-50 MG PO TABS
2.0000 | ORAL_TABLET | Freq: Every day | ORAL | Status: DC
  Administered 2011-05-03 – 2011-05-04 (×2): 2 via ORAL

## 2011-05-03 MED ORDER — DIBUCAINE 1 % RE OINT: 1.0000 "application " | TOPICAL_OINTMENT | RECTAL | Status: DC | PRN

## 2011-05-03 SURGICAL SUPPLY — 22 items
CHLORAPREP W/TINT 26ML (MISCELLANEOUS) ×2 IMPLANT
CLOTH BEACON ORANGE TIMEOUT ST (SAFETY) ×2 IMPLANT
CONTAINER PREFILL 10% NBF 15ML (MISCELLANEOUS) ×4 IMPLANT
DRSG COVADERM PLUS 2X2 (GAUZE/BANDAGES/DRESSINGS) ×1 IMPLANT
GLOVE BIO SURGEON STRL SZ 6.5 (GLOVE) ×4 IMPLANT
GLOVE BIOGEL PI IND STRL 6.5 (GLOVE) IMPLANT
GLOVE BIOGEL PI INDICATOR 6.5 (GLOVE) ×1
GLOVE ECLIPSE 6.0 STRL STRAW (GLOVE) ×1 IMPLANT
GOWN PREVENTION PLUS LG XLONG (DISPOSABLE) ×4 IMPLANT
NDL HYPO 25X1 1.5 SAFETY (NEEDLE) IMPLANT
NEEDLE HYPO 25X1 1.5 SAFETY (NEEDLE) IMPLANT
NS IRRIG 1000ML POUR BTL (IV SOLUTION) ×2 IMPLANT
PACK ABDOMINAL MINOR (CUSTOM PROCEDURE TRAY) ×2 IMPLANT
SPONGE LAP 4X18 X RAY DECT (DISPOSABLE) IMPLANT
SUT PLAIN 0 NONE (SUTURE) ×2 IMPLANT
SUT VIC AB 2-0 CT1 (SUTURE) ×2 IMPLANT
SUT VIC AB 3-0 PS2 18 (SUTURE) ×2
SUT VIC AB 3-0 PS2 18XBRD (SUTURE) ×1 IMPLANT
SYR CONTROL 10ML LL (SYRINGE) IMPLANT
TOWEL OR 17X24 6PK STRL BLUE (TOWEL DISPOSABLE) ×4 IMPLANT
TRAY FOLEY CATH 14FR (SET/KITS/TRAYS/PACK) ×2 IMPLANT
WATER STERILE IRR 1000ML POUR (IV SOLUTION) ×1 IMPLANT

## 2011-05-03 NOTE — Anesthesia Preprocedure Evaluation (Signed)
Anesthesia Evaluation  Patient identified by MRN, date of birth, ID band Patient awake    Reviewed: Allergy & Precautions, H&P , Patient's Chart, lab work & pertinent test results  Airway Mallampati: III TM Distance: >3 FB Neck ROM: full    Dental No notable dental hx.    Pulmonary neg pulmonary ROS,  breath sounds clear to auscultation  Pulmonary exam normal       Cardiovascular negative cardio ROS  Rhythm:regular Rate:Normal     Neuro/Psych negative neurological ROS  negative psych ROS   GI/Hepatic negative GI ROS, Neg liver ROS,   Endo/Other  negative endocrine ROSDiabetes mellitus-  Renal/GU negative Renal ROS     Musculoskeletal   Abdominal   Peds  Hematology negative hematology ROS (+)   Anesthesia Other Findings   Reproductive/Obstetrics (+) Pregnancy                           Anesthesia Physical Anesthesia Plan  ASA: III  Anesthesia Plan: Epidural   Post-op Pain Management:    Induction:   Airway Management Planned:   Additional Equipment:   Intra-op Plan:   Post-operative Plan:   Informed Consent: I have reviewed the patients History and Physical, chart, labs and discussed the procedure including the risks, benefits and alternatives for the proposed anesthesia with the patient or authorized representative who has indicated his/her understanding and acceptance.     Plan Discussed with:   Anesthesia Plan Comments:         Anesthesia Quick Evaluation  

## 2011-05-03 NOTE — Anesthesia Postprocedure Evaluation (Signed)
  Anesthesia Post-op Note  Patient: Paige Leon  Procedure(s) Performed: Procedure(s) (LRB): POST PARTUM TUBAL LIGATION (Bilateral)  Patient is awake, responsive, moving her legs, and has signs of resolution of her numbness. Pain and nausea are reasonably well controlled. Vital signs are stable and clinically acceptable. Oxygen saturation is clinically acceptable. There are no apparent anesthetic complications at this time. Patient is ready for discharge.

## 2011-05-03 NOTE — Anesthesia Procedure Notes (Signed)
Spinal  Patient location during procedure: OR Start time: 05/03/2011 1:10 PM Staffing Anesthesiologist: Knox Holdman A. Performed by: anesthesiologist  Preanesthetic Checklist Completed: patient identified, site marked, surgical consent, pre-op evaluation, timeout performed, IV checked, risks and benefits discussed and monitors and equipment checked Spinal Block Patient position: sitting Prep: site prepped and draped and DuraPrep Patient monitoring: heart rate, cardiac monitor, continuous pulse ox and blood pressure Approach: midline Location: L3-4 Injection technique: single-shot Needle Needle type: Sprotte  Needle gauge: 24 G Needle length: 9 cm Needle insertion depth: 5.5 cm Assessment Sensory level: T8 Additional Notes Patient tolerated procedure well. Adequate sensory level.

## 2011-05-03 NOTE — Progress Notes (Signed)
Dr. Malen Gauze aware of pt's blood sugar 69.. No new orders given.

## 2011-05-03 NOTE — Anesthesia Procedure Notes (Signed)
Epidural Patient location during procedure: OB Start time: 05/03/2011 7:02 AM  Staffing Anesthesiologist: Brayton Caves R Performed by: anesthesiologist   Preanesthetic Checklist Completed: patient identified, site marked, surgical consent, pre-op evaluation, timeout performed, IV checked, risks and benefits discussed and monitors and equipment checked  Epidural Patient position: sitting Prep: site prepped and draped and DuraPrep Patient monitoring: continuous pulse ox and blood pressure Approach: midline Injection technique: LOR air and LOR saline  Needle:  Needle type: Tuohy  Needle gauge: 17 G Needle length: 9 cm Needle insertion depth: 9 cm Catheter type: closed end flexible Catheter size: 19 Gauge Catheter at skin depth: 15 cm Test dose: negative  Assessment Events: blood not aspirated, injection not painful, no injection resistance, negative IV test and no paresthesia  Additional Notes Patient identified.  Risk benefits discussed including failed block, incomplete pain control, headache, nerve damage, paralysis, blood pressure changes, nausea, vomiting, reactions to medication both toxic or allergic, and postpartum back pain.  Patient expressed understanding and wished to proceed.  All questions were answered.  Sterile technique used throughout procedure and epidural site dressed with sterile barrier dressing. No paresthesia or other complications noted.The patient did not experience any signs of intravascular injection such as tinnitus or metallic taste in mouth nor signs of intrathecal spread such as rapid motor block. Please see nursing notes for vital signs.

## 2011-05-03 NOTE — Transfer of Care (Signed)
Immediate Anesthesia Transfer of Care Note  Patient: Paige Leon  Procedure(s) Performed: Procedure(s) (LRB): POST PARTUM TUBAL LIGATION (Bilateral)  Patient Location: PACU  Anesthesia Type: Spinal  Level of Consciousness: sedated  Airway & Oxygen Therapy: Patient Spontanous Breathing  Post-op Assessment: Report given to PACU RN  Post vital signs: Reviewed and stable  Complications: No apparent anesthesia complications

## 2011-05-03 NOTE — Anesthesia Preprocedure Evaluation (Signed)
Anesthesia Evaluation  Patient identified by MRN, date of birth, ID band Patient awake    Reviewed: Allergy & Precautions, H&P , NPO status , Patient's Chart, lab work & pertinent test results  Airway Mallampati: III TM Distance: >3 FB Neck ROM: full    Dental No notable dental hx.    Pulmonary neg pulmonary ROS,  breath sounds clear to auscultation  Pulmonary exam normal       Cardiovascular negative cardio ROS  Rhythm:regular Rate:Normal     Neuro/Psych negative neurological ROS  negative psych ROS   GI/Hepatic negative GI ROS, Neg liver ROS,   Endo/Other  negative endocrine ROSDiabetes mellitus-  Renal/GU negative Renal ROS     Musculoskeletal   Abdominal   Peds  Hematology negative hematology ROS (+)   Anesthesia Other Findings Epidural not functioning well. Will D/C epidural and perform SAB. Discussed with patient risks, benefits and alternatives of planned anesthetic.  Reproductive/Obstetrics negative OB ROS                           Anesthesia Physical  Anesthesia Plan  ASA: III  Anesthesia Plan: Spinal   Post-op Pain Management:    Induction:   Airway Management Planned:   Additional Equipment:   Intra-op Plan:   Post-operative Plan:   Informed Consent: I have reviewed the patients History and Physical, chart, labs and discussed the procedure including the risks, benefits and alternatives for the proposed anesthesia with the patient or authorized representative who has indicated his/her understanding and acceptance.     Plan Discussed with: Surgeon, CRNA and Anesthesiologist  Anesthesia Plan Comments:         Anesthesia Quick Evaluation

## 2011-05-03 NOTE — H&P (Signed)
36 year old G 3 P 1011 at 27 w 3 days presents for induction. She has Diabetes mellitus under fair control and because of this had an amniocentesis yesterday that showed mature lung profile. Prenatal care see [facility]: 1. History of NSVD x 1 2. Diabetes Mellitus sees Dr. Talmage Nap with fair control 3. 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 4. Fetal pyelectasis - followed with serial ultrasounds 5. 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  NKDA  Afebrile  Vital signs stable General alert and oriented Lung CTAB Car RRR Abdomen is soft and non tender Cervix now status post epidural 90%/4/0 AROM  Clear Fluid CBGs are normal  IMPRESSION: IUP at term Diabetes HSV Fetal Pyelectasis Absent Nasal bone  PLAN: Continue induction started last night Pitocin possibly Monitor CBGs

## 2011-05-03 NOTE — Brief Op Note (Signed)
05/02/2011 - 05/03/2011  1:40 PM  PATIENT:  Paige Leon  36 y.o. female  PRE-OPERATIVE DIAGNOSIS:  desire sterilization  POST-OPERATIVE DIAGNOSIS:  desire sterilization  PROCEDURE:  Procedure(s) (LRB): POST PARTUM TUBAL LIGATION (Bilateral) Modified Pomeroy Method  SURGEON:  Surgeon(s) and Role:    * Jeani Hawking, MD - Primary  PHYSICIAN ASSISTANT:   ASSISTANTS: none   ANESTHESIA:   spinal  EBL:  Total I/O In: 650 [I.V.:650] Out: 305 [Urine:300; Blood:5]  BLOOD ADMINISTERED:none  DRAINS: Urinary Catheter (Foley)   LOCAL MEDICATIONS USED:  NONE  SPECIMEN:  Source of Specimen:  bilateral fallopian tube segments  DISPOSITION OF SPECIMEN:  PATHOLOGY  COUNTS:  YES  TOURNIQUET:  * No tourniquets in log *  DICTATION: .Other Dictation: Dictation Number [id number]  PLAN OF CARE: Admit to inpatient   PATIENT DISPOSITION:  PACU - hemodynamically stable.   Delay start of Pharmacological VTE agent (>24hrs) due to surgical blood loss or risk of bleeding: yes

## 2011-05-04 LAB — GLUCOSE, CAPILLARY
Glucose-Capillary: 106 mg/dL — ABNORMAL HIGH (ref 70–99)
Glucose-Capillary: 69 mg/dL — ABNORMAL LOW (ref 70–99)

## 2011-05-04 LAB — CBC
HCT: 31.9 % — ABNORMAL LOW (ref 36.0–46.0)
Hemoglobin: 10.5 g/dL — ABNORMAL LOW (ref 12.0–15.0)
MCHC: 32.9 g/dL (ref 30.0–36.0)
RBC: 3.52 MIL/uL — ABNORMAL LOW (ref 3.87–5.11)
WBC: 9.7 10*3/uL (ref 4.0–10.5)

## 2011-05-04 NOTE — Addendum Note (Signed)
Addendum  created 05/04/11 0946 by Graciela Husbands, CRNA   Modules edited:Notes Section

## 2011-05-04 NOTE — Progress Notes (Signed)
Post Partum Day 1 Subjective: up ad lib, voiding and tolerating PO  Objective: Blood pressure 101/67, pulse 73, temperature 97.5 F (36.4 C), temperature source Oral, resp. rate 20, height 5\' 5"  (1.651 m), weight 90.719 kg (200 lb), last menstrual period 08/14/2010, SpO2 98.00%, unknown if currently breastfeeding.  Physical Exam:  General: alert and cooperative Lochia: appropriate Uterine Fundus: firm Incision: healing well DVT Evaluation: No evidence of DVT seen on physical exam.   Basename 05/04/11 0540 05/02/11 2015  HGB 10.5* 11.1*  HCT 31.9* 33.8*    Assessment/Plan: Plan for discharge tomorrow   LOS: 2 days   CURTIS,CAROL G 05/04/2011, 8:24 AM

## 2011-05-04 NOTE — Op Note (Signed)
Paige Leon, Paige Leon                ACCOUNT NO.:  [id number]  MEDICAL RECORD NO.:  [id number]  LOCATION:  9107                          FACILITY:  [facility]  PHYSICIAN:  Slayton Lubitz L. Aidee Latimore, M.D.DATE OF BIRTH:  12/12/1975  DATE OF PROCEDURE:  05/03/2011 DATE OF DISCHARGE:                              OPERATIVE REPORT   PREOPERATIVE DIAGNOSIS:  Postpartum and desires permanent sterilization.  POSTOPERATIVE DIAGNOSIS:  Postpartum and desires permanent sterilization.  PROCEDURES:  Modified Pomeroy bilateral tubal ligation.  SURGEON:  Kemia Wendel L. Aviana Shevlin, MD  ANESTHESIA:  Spinal.  EBL:  Minimal.  COMPLICATIONS:  None.  PROCEDURE:  The patient was taken to the operating room where spinal was placed.  She was then prepped and draped in usual sterile fashion.  A Foley catheter had been inserted.  Allis test was performed and it was sufficient.  A small infraumbilical incision was made with a scalpel. The fascia was identified easily.  The fascia was elevated with Allis clamps and the fascia and peritoneum were entered.  Once we entered the peritoneal cavity, we then used both direct visualization as well as palpation, we were able to identify the right fallopian tube first.  I followed it out to the fimbriated end and grasped the midportion of the tube with the Babcock clamp and tied off the 3-cm knuckle with plain gut x2, that knuckle of tissue was then excised and sent to pathology and labeled the fallopian tube segment.  In identical fashion, the left fallopian tube was identified all the way to its fimbriated end and the midportion of the tube was excised after being tied off with plain gut suture x2, and then it was sent off and identified and labeled as left fallopian tube segment.  Hemostasis was excellent.  The fascia and peritoneum was closed using 0 Vicryl in a running stitch.  The skin was closed with 3-0 Vicryl on the subcuticular.  Local was infiltrated.  All sponge, lap, and  instrument counts were correct x2.  The patient went to recovery room in stable condition.     Yoanna Jurczyk L. Vincente Poli, M.D.     Florestine Avers  D:  05/03/2011  T:  05/04/2011  Job:  [id number]

## 2011-05-04 NOTE — Anesthesia Postprocedure Evaluation (Signed)
  Anesthesia Post-op Note  Patient: Paige Leon  Procedure(s) Performed: * No procedures listed *  Patient Location: 107  Anesthesia Type: Epidural  Level of Consciousness: awake, alert  and oriented  Airway and Oxygen Therapy: Patient Spontanous Breathing  Post-op Pain: mild  Post-op Assessment: Post-op Vital signs reviewed, Patient's Cardiovascular Status Stable, No headache, No residual numbness and No residual motor weakness  Post-op Vital Signs: Reviewed and stable  Complications: No apparent anesthesia complications

## 2011-05-04 NOTE — Anesthesia Postprocedure Evaluation (Signed)
  Anesthesia Post-op Note  Patient: Paige Leon  Procedure(s) Performed: Procedure(s) (LRB): POST PARTUM TUBAL LIGATION (Bilateral)  Patient Location: 107  Anesthesia Type: Spinal  Level of Consciousness: awake, alert  and oriented  Airway and Oxygen Therapy: Patient Spontanous Breathing  Post-op Pain: mild  Post-op Assessment: Post-op Vital signs reviewed, Patient's Cardiovascular Status Stable, No headache, No residual numbness and No residual motor weakness. Patient complained of soreness at needle (3) insertion sites.  Post-op Vital Signs: Reviewed and stable  Complications: No apparent anesthesia complications

## 2011-05-05 MED ORDER — IBUPROFEN 600 MG PO TABS: 600.0000 mg | ORAL_TABLET | Freq: Four times a day (QID) | ORAL | Status: AC

## 2011-05-05 MED ORDER — OXYCODONE-ACETAMINOPHEN 5-325 MG PO TABS: 1.0000 | ORAL_TABLET | Freq: Four times a day (QID) | ORAL | Status: AC | PRN

## 2011-05-05 NOTE — Discharge Summary (Signed)
Obstetric Discharge Summary Reason for Admission: induction of labor Prenatal Procedures: amniocentesis Intrapartum Procedures: spontaneous vaginal delivery Postpartum Procedures: P.P. tubal ligation Complications-Operative and Postpartum: none Hemoglobin  Date Value Range Status  05/04/2011 10.5* 12.0-15.0 (g/dL) Final     HCT  Date Value Range Status  05/04/2011 31.9* 36.0-46.0 (%) Final    Physical Exam:  General: alert Lochia: appropriate Uterine Fundus: firm Incision: healing well DVT Evaluation: No evidence of DVT seen on physical exam.  Discharge Diagnoses: Term Pregnancy-delivered  Discharge Information: Date: 05/05/2011 Activity: pelvic rest Diet: routine Medications: Ibuprofen and Percocet, Metformin and insulin  Condition: stable Instructions: refer to practice specific booklet Discharge to: home, F/U with Dr Talmage Nap re insulin mgmt Follow-up Information    Follow up with Meriel Pica, MD. Schedule an appointment as soon as possible for a visit in 6 weeks.   Contact information:   [address] [place] [address] [phone]          Newborn Data: Live born female  Birth Weight: 6 lb 10.2 oz (3011 g) APGAR: 4, 7  Home with mother.  Meriel Pica 05/05/2011, 8:37 AM

## 2011-05-05 NOTE — Progress Notes (Signed)
Post Partum Day 2 Subjective: no complaints  Objective: Blood pressure 105/70, pulse 76, temperature 98.2 F (36.8 C), temperature source Oral, resp. rate 18, height 5\' 5"  (1.651 m), weight 200 lb (90.719 kg), last menstrual period 08/14/2010, SpO2 98.00%, unknown if currently breastfeeding.  Physical Exam:  General: alert Lochia: appropriate Uterine Fundus: firm Incision: healing well DVT Evaluation: No evidence of DVT seen on physical exam.   Basename 05/04/11 0540 05/02/11 2015  HGB 10.5* 11.1*  HCT 31.9* 33.8*    Assessment/Plan: Discharge home   LOS: 3 days   Kilian Schwartz M 05/05/2011, 8:34 AM

## 2011-05-06 ENCOUNTER — Encounter (HOSPITAL_COMMUNITY): Payer: Self-pay | Admitting: Obstetrics and Gynecology

## 2011-06-20 ENCOUNTER — Other Ambulatory Visit: Payer: Self-pay | Admitting: Obstetrics and Gynecology

## 2012-09-17 ENCOUNTER — Other Ambulatory Visit: Payer: Self-pay | Admitting: Obstetrics and Gynecology

## 2012-10-11 ENCOUNTER — Other Ambulatory Visit: Payer: Self-pay | Admitting: Obstetrics and Gynecology

## 2013-07-04 IMAGING — US US OB DETAIL+14 WK
1 series · 14 of 28 positions shown · non-contrast
Comparison: none

[Series 1: us ob detail+14 wk · 14 of 82 slices shown]
[im 4/82]
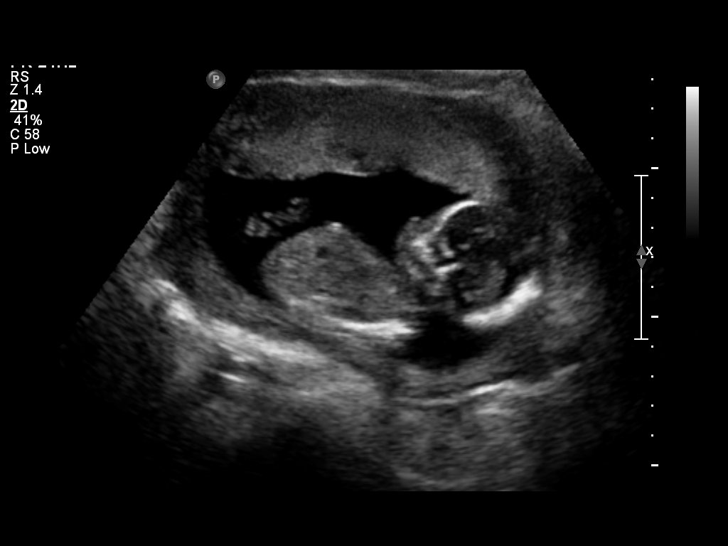
[im 10/82]
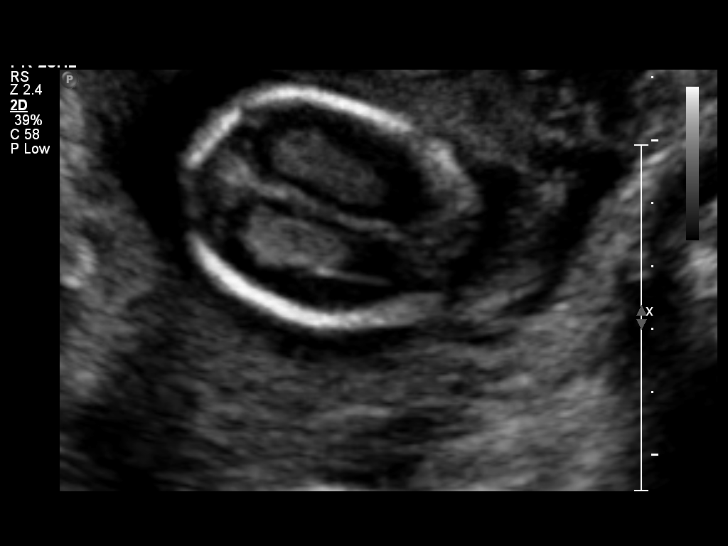
[im 16/82]
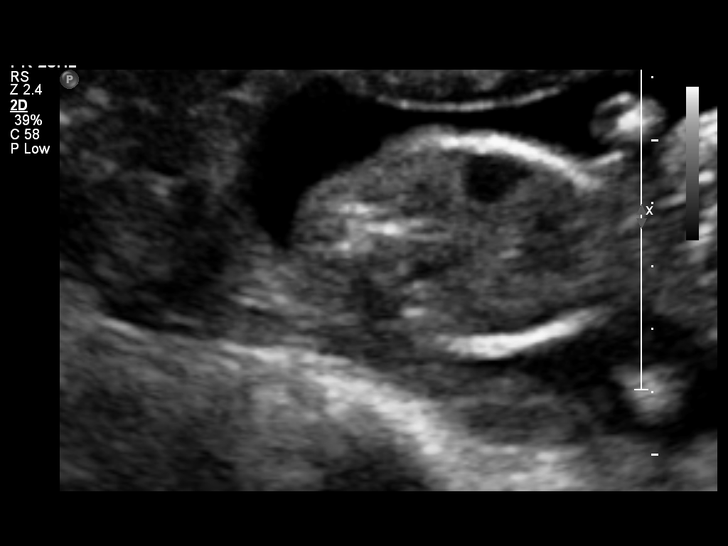
[im 22/82]
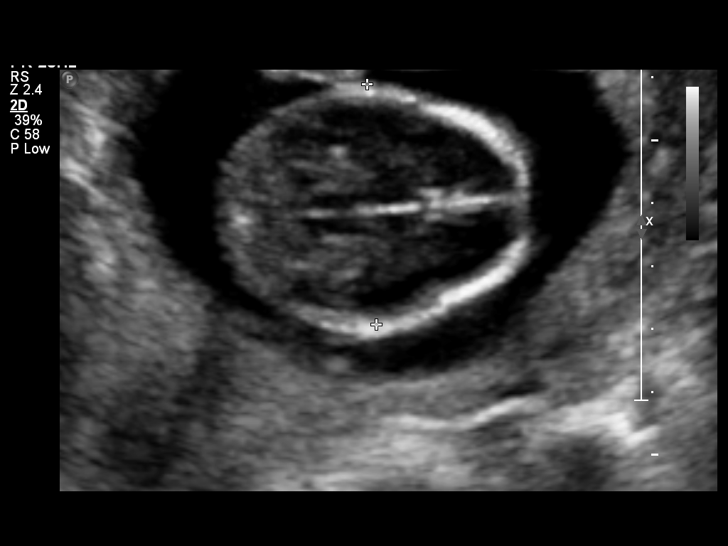
[im 28/82]
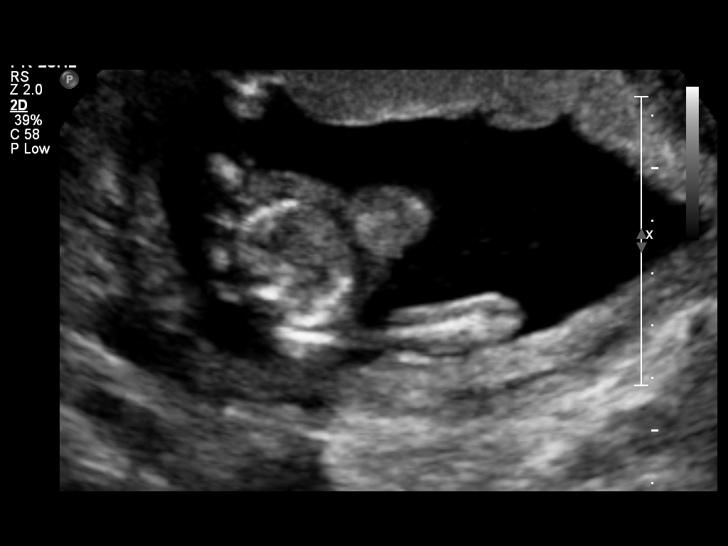
[im 34/82]
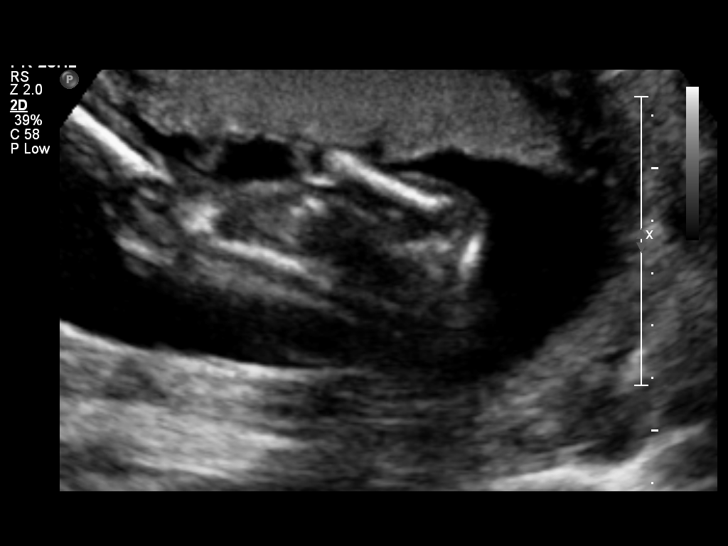
[im 40/82]
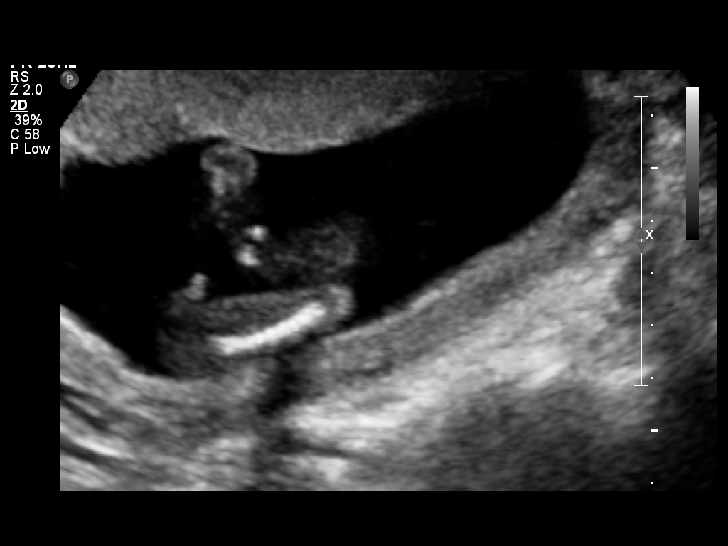
[im 46/82]
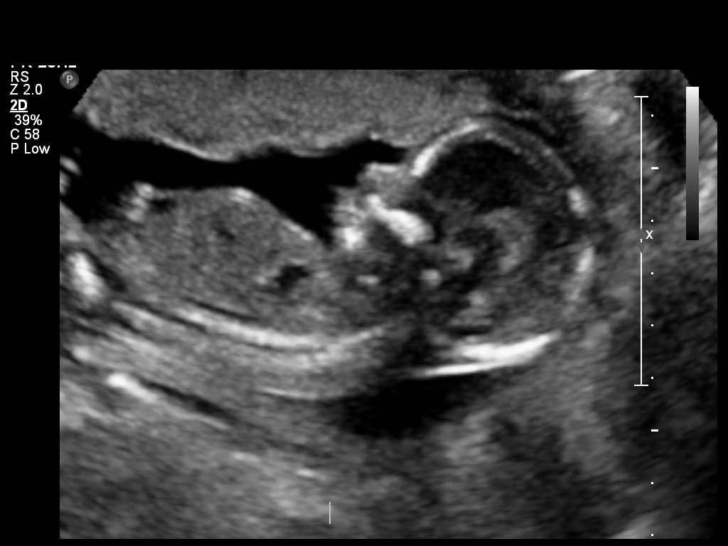
[im 52/82]
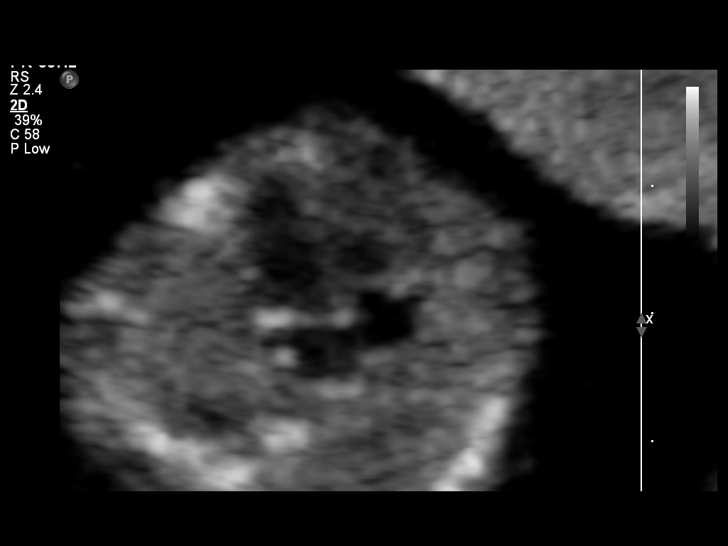
[im 58/82]
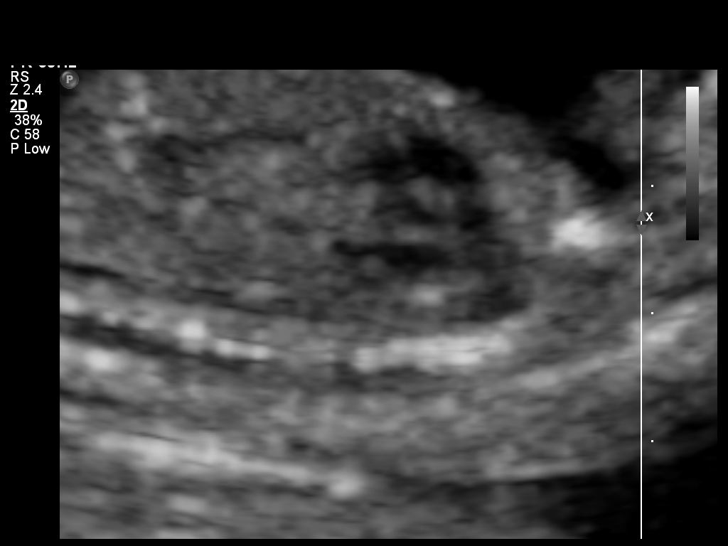
[im 64/82]
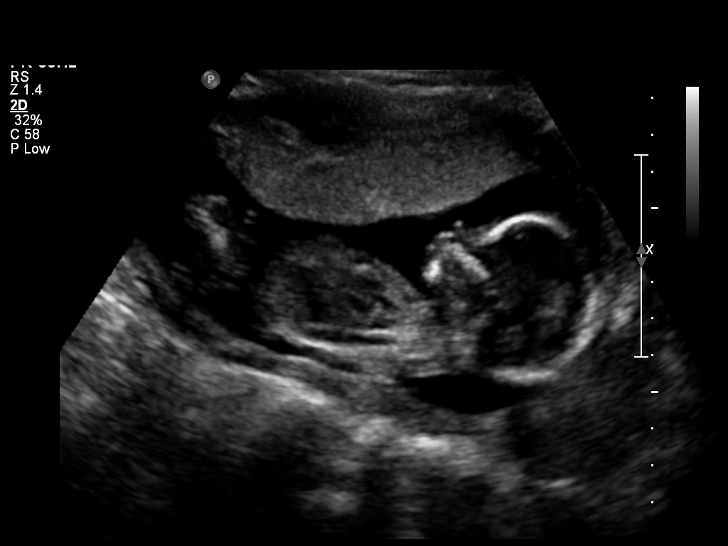
[im 70/82]
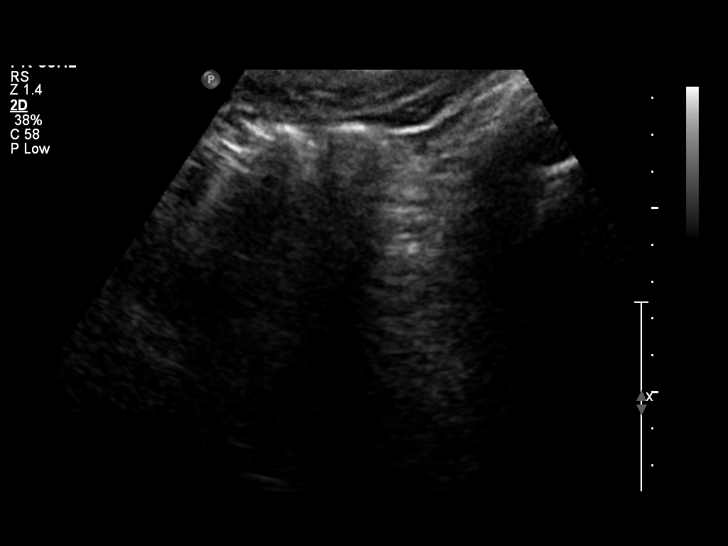
[im 76/82]
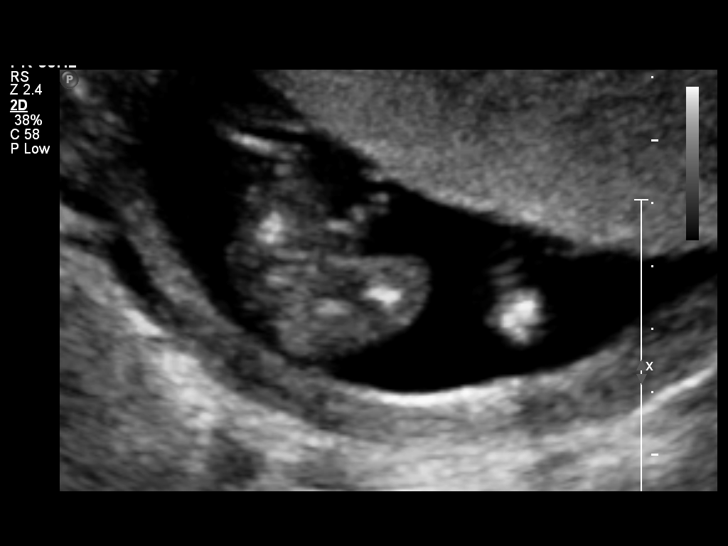
[im 82/82]
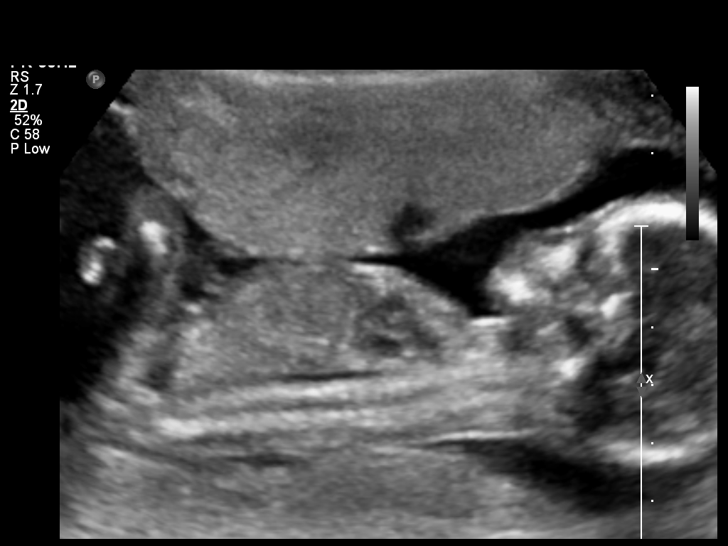

[14 of 28 positions shown; findings below may reference images not displayed]

Canned report from images found in remote index.

Refer to host system for actual result text.

## 2013-10-08 ENCOUNTER — Other Ambulatory Visit: Payer: Self-pay | Admitting: Obstetrics and Gynecology

## 2013-10-09 LAB — CYTOLOGY - PAP

## 2013-12-08 ENCOUNTER — Encounter (HOSPITAL_COMMUNITY): Payer: Self-pay | Admitting: Obstetrics and Gynecology

## 2014-04-07 ENCOUNTER — Emergency Department (HOSPITAL_COMMUNITY)
Admission: EM | Admit: 2014-04-07 | Discharge: 2014-04-07 | Disposition: A | Discharge status: Home / Self Care | Payer: Self-pay | Source: Home / Self Care

## 2014-07-15 DIAGNOSIS — M7070 Other bursitis of hip, unspecified hip: Secondary | ICD-10-CM | POA: Insufficient documentation

## 2014-11-04 ENCOUNTER — Other Ambulatory Visit: Payer: Self-pay | Admitting: Obstetrics and Gynecology

## 2014-11-05 LAB — CYTOLOGY - PAP

## 2016-12-18 DIAGNOSIS — R87610 Atypical squamous cells of undetermined significance on cytologic smear of cervix (ASC-US): Secondary | ICD-10-CM | POA: Insufficient documentation

## 2017-03-05 ENCOUNTER — Ambulatory Visit (HOSPITAL_BASED_OUTPATIENT_CLINIC_OR_DEPARTMENT_OTHER): Admit: 2017-03-05 | Payer: 59 | Admitting: Obstetrics and Gynecology

## 2017-03-05 ENCOUNTER — Encounter (HOSPITAL_BASED_OUTPATIENT_CLINIC_OR_DEPARTMENT_OTHER): Payer: Self-pay

## 2017-03-05 SURGERY — HYSTERECTOMY, TOTAL, ABDOMINAL, WITH SALPINGECTOMY
Anesthesia: General

## 2017-10-10 ENCOUNTER — Other Ambulatory Visit: Payer: Self-pay | Admitting: Obstetrics and Gynecology

## 2017-10-10 DIAGNOSIS — R928 Other abnormal and inconclusive findings on diagnostic imaging of breast: Secondary | ICD-10-CM

## 2017-10-12 ENCOUNTER — Ambulatory Visit: Payer: 59

## 2017-10-12 ENCOUNTER — Ambulatory Visit
Admission: RE | Admit: 2017-10-12 | Discharge: 2017-10-12 | Disposition: A | Discharge status: Home / Self Care | Payer: BLUE CROSS/BLUE SHIELD | Source: Ambulatory Visit | Attending: Obstetrics and Gynecology | Admitting: Obstetrics and Gynecology

## 2017-10-12 DIAGNOSIS — R928 Other abnormal and inconclusive findings on diagnostic imaging of breast: Secondary | ICD-10-CM

## 2019-11-05 ENCOUNTER — Other Ambulatory Visit: Payer: Self-pay

## 2019-11-05 DIAGNOSIS — Z20822 Contact with and (suspected) exposure to covid-19: Secondary | ICD-10-CM

## 2019-11-06 LAB — NOVEL CORONAVIRUS, NAA: SARS-CoV-2, NAA: NOT DETECTED

## 2019-11-06 LAB — SARS-COV-2, NAA 2 DAY TAT

## 2020-08-04 DIAGNOSIS — E113292 Type 2 diabetes mellitus with mild nonproliferative diabetic retinopathy without macular edema, left eye: Secondary | ICD-10-CM | POA: Insufficient documentation

## 2020-09-01 ENCOUNTER — Ambulatory Visit: Payer: BC Managed Care – PPO | Admitting: Podiatry

## 2020-09-08 ENCOUNTER — Ambulatory Visit (INDEPENDENT_AMBULATORY_CARE_PROVIDER_SITE_OTHER): Payer: BC Managed Care – PPO

## 2020-09-08 ENCOUNTER — Other Ambulatory Visit: Payer: Self-pay

## 2020-09-08 ENCOUNTER — Encounter: Payer: Self-pay | Admitting: Podiatry

## 2020-09-08 ENCOUNTER — Ambulatory Visit: Payer: BC Managed Care – PPO | Admitting: Podiatry

## 2020-09-08 DIAGNOSIS — M7750 Other enthesopathy of unspecified foot: Secondary | ICD-10-CM

## 2020-09-08 DIAGNOSIS — G5763 Lesion of plantar nerve, bilateral lower limbs: Secondary | ICD-10-CM | POA: Diagnosis not present

## 2020-09-08 DIAGNOSIS — M7751 Other enthesopathy of right foot: Secondary | ICD-10-CM

## 2020-09-08 DIAGNOSIS — M7752 Other enthesopathy of left foot: Secondary | ICD-10-CM

## 2020-09-12 ENCOUNTER — Encounter: Payer: Self-pay | Admitting: Podiatry

## 2020-09-12 NOTE — Progress Notes (Signed)
  Subjective:  Patient ID: Paige Leon, female    DOB: [birth year],  MRN: [id number]  Chief Complaint  Patient presents with   Foot Pain      NP - BILAT FOOT PAIN    45 y.o. female presents with the above complaint. History confirmed with patient.  She describes pain in the ball of the foot feels like she is walking on something.  It has been going on for several months.   Objective:  Physical Exam: warm, good capillary refill, no trophic changes or ulcerative lesions, normal DP and PT pulses, and normal sensory exam.  Bilaterally she has pain in the third interspace to palpation   Radiographs: Multiple views x-ray of both feet: no fracture, dislocation, swelling or degenerative changes noted Assessment:   1. Morton's neuroma of both feet   2. Tendonitis of ankle or foot      Plan:  Patient was evaluated and treated and all questions answered.  Morton Neuroma -Educated on etiology -Educated on padding and proper shoegear -XR reviewed with patient -Injection delivered to the affected interspaces  Procedure: Neuroma Injection Location: Bilateral third interspace Skin Prep: Alcohol. Injectate: 1 cc 0.5% marcaine plain, 2 mg dexamethasone and 5 mg Kenalog to each interspace Disposition: Patient tolerated procedure well. Injection site dressed with a band-aid.  Return in about 8 weeks (around 11/03/2020) for neuroma follow up .

## 2020-11-03 ENCOUNTER — Ambulatory Visit: Payer: BC Managed Care – PPO | Admitting: Podiatry

## 2020-11-08 ENCOUNTER — Other Ambulatory Visit: Payer: Self-pay

## 2020-11-08 ENCOUNTER — Ambulatory Visit: Payer: BC Managed Care – PPO | Admitting: Podiatry

## 2020-11-08 DIAGNOSIS — G5763 Lesion of plantar nerve, bilateral lower limbs: Secondary | ICD-10-CM

## 2020-11-08 DIAGNOSIS — L600 Ingrowing nail: Secondary | ICD-10-CM | POA: Diagnosis not present

## 2020-11-08 NOTE — Patient Instructions (Signed)

## 2020-11-08 NOTE — Progress Notes (Signed)
  Subjective:  Patient ID: Paige Leon, female    DOB: [birth year],  MRN: [id number]  Chief Complaint  Patient presents with   Neuroma     8 week follow up BILATERAL FOOT PAIN    45 y.o. female returns with the above complaint. History confirmed with patient.  Injections helped quite a bit is nearly completely limited the pain in the left foot from the neuroma and the right foot is almost completely better but still tender here and there.  She has new pain redness and swelling in the left fourth toe  Objective:  Physical Exam: warm, good capillary refill, no trophic changes or ulcerative lesions, normal DP and PT pulses, and normal sensory exam.  No pain left third interspace, minimal in right third interspace.  Paronychia of the left lateral fourth toe   Radiographs: Multiple views x-ray of both feet: no fracture, dislocation, swelling or degenerative changes noted Assessment:   1. Morton's neuroma of both feet   2. Ingrowing nail with infection      Plan:  Patient was evaluated and treated and all questions answered.  Morton Neuroma -Overall seems to be almost quiescent and I recommend she continue to monitor these, the right foot may need another injection soon which we discussed but hopefully resolves on its own at this point.  She has an ingrown toenail of the left fourth toe.  Recommended excision of the paronychia today.  Following digital block with Marcaine lidocaine and sterile prep with Betadine I remove the lateral border of the nail, no matricectomy was performed.  Soaking recommended.  I will reevaluate her as needed for this.  Hopefully not an issue in the future.  Return if symptoms worsen or fail to improve.

## 2020-12-08 ENCOUNTER — Other Ambulatory Visit: Payer: Self-pay

## 2020-12-08 ENCOUNTER — Ambulatory Visit: Payer: BC Managed Care – PPO | Admitting: Podiatry

## 2020-12-08 DIAGNOSIS — G5763 Lesion of plantar nerve, bilateral lower limbs: Secondary | ICD-10-CM | POA: Diagnosis not present

## 2020-12-12 ENCOUNTER — Encounter: Payer: Self-pay | Admitting: Podiatry

## 2020-12-12 NOTE — Progress Notes (Signed)
  Subjective:  Patient ID: Paige Leon, female    DOB: [birth year],  MRN: [id number]  Chief Complaint  Patient presents with   Neuroma    Bilateral Morton's neuroma, 4 week follow up    45 y.o. female returns with the above complaint. History confirmed with patient.  The ingrown nail is doing much better and is healing, the neuromas are not hurting either at this point  Objective:  Physical Exam: warm, good capillary refill, no trophic changes or ulcerative lesions, normal DP and PT pulses, and normal sensory exam.  No pain bilateral third interspace   Radiographs: Multiple views x-ray of both feet: no fracture, dislocation, swelling or degenerative changes noted Assessment:   1. Morton's neuroma of both feet      Plan:  Patient was evaluated and treated and all questions answered.  Morton Neuroma -Overall seems to be doing much better not having any pain after the last injection and the ingrown nail is doing much better as well. Return if symptoms worsen or fail to improve.

## 2022-05-01 ENCOUNTER — Ambulatory Visit: Payer: BC Managed Care – PPO | Admitting: Podiatry

## 2022-05-08 ENCOUNTER — Ambulatory Visit: Payer: BC Managed Care – PPO | Admitting: Podiatry

## 2022-05-08 ENCOUNTER — Encounter: Payer: Self-pay | Admitting: Podiatry

## 2022-05-08 DIAGNOSIS — B351 Tinea unguium: Secondary | ICD-10-CM

## 2022-05-08 MED ORDER — EFINACONAZOLE 10 % EX SOLN: 1.0000 [drp] | Freq: Every day | CUTANEOUS | 11 refills | Status: AC

## 2022-05-08 NOTE — Progress Notes (Signed)
  Subjective:  Patient ID: Paige Leon, female    DOB: 08/01/1975,  MRN: [id number]  Chief Complaint  Patient presents with   Nail Problem    L Great toenail came off. Fungus? She has acrylic on all her toenails  c     47 y.o. female presents with the above complaint. History confirmed with patient.  She presents with a new issue, she had an acrylic nail on and it shedded off and the underlying nail was discolored and crumbling  Objective:  Physical Exam: warm, good capillary refill, no trophic changes or ulcerative lesions, normal DP and PT pulses, normal sensory exam, and dystrophic yellow discoloration of the distal portion of the left great toe      Assessment:   1. Onychomycosis      Plan:  Patient was evaluated and treated and all questions answered.  Discussed etiology and treatment options of onychomycosis.  I recommended treatment with topical therapy.  Rx for Jublia sent to specialty pharmacy.  Follow-up in 6 months for patient, photographs taken  Return in about 6 months (around 11/07/2022) for follow up after nail fungus treatment.

## 2022-11-08 ENCOUNTER — Ambulatory Visit: Payer: BC Managed Care – PPO | Admitting: Podiatry

## 2022-11-08 ENCOUNTER — Encounter: Payer: Self-pay | Admitting: Podiatry

## 2022-11-08 VITALS — BP 100/69 | HR 96

## 2022-11-08 DIAGNOSIS — G5763 Lesion of plantar nerve, bilateral lower limbs: Secondary | ICD-10-CM | POA: Diagnosis not present

## 2022-11-08 NOTE — Progress Notes (Signed)
  Subjective:  Patient ID: Paige Leon, female    DOB: 06-May-1975,  MRN: [id number]  Chief Complaint  Patient presents with   Nail Problem    "Okay, I guess."   Foot Pain    "I need him to look at this Neuroma."    47 y.o. female presents with the above complaint. History confirmed with patient.  The left foot neuroma is painful again she thinks it may need an injection again the right foot is doing a little bit better, the nail fungus is doing much better  Objective:  Physical Exam: warm, good capillary refill, no trophic changes or ulcerative lesions, normal DP and PT pulses, normal sensory exam, and nail fungus has improved, polish present today, pain ovation to third interspace on left not on right     Assessment:   1. Morton's neuroma of both feet      Plan:  Patient was evaluated and treated and all questions answered.  Onychomycosis improving she may continue to utilize the [place], when she has her false nail removed she will send me a picture on MyChart to see if we need to continue therapy or not.  Her Morton's neuroma was symptomatic again on the left foot and not on the right.  Corticosteroid injection has helped this previously a few years ago.  I recommended repeat injection therapy.  Following consent and prepped with alcohol the left third interspace were injected from a dorsal approach with 2 mg of dexamethasone, 5 mg Kenalog and 1 cc of 0.5% Marcaine plain  Return if symptoms worsen or fail to improve.

## 2024-01-09 ENCOUNTER — Ambulatory Visit: Admitting: Podiatry

## 2024-01-09 DIAGNOSIS — G5763 Lesion of plantar nerve, bilateral lower limbs: Secondary | ICD-10-CM | POA: Diagnosis not present

## 2024-01-09 NOTE — Progress Notes (Signed)
  Subjective:  Patient ID: Janine LOISE Olds, female    DOB: 1975-04-08,  MRN: [id number]  Chief Complaint  Patient presents with   Injections    alcohol injection -neuroma under the 4th toe left foot    48 y.o. female presents with the above complaint. History confirmed with patient.  The left foot neuroma is painful again, A1c has been somewhat elevated and is here today for sclerosing alcohol injection.  Objective:  Physical Exam: warm, good capillary refill, no trophic changes or ulcerative lesions, normal DP and PT pulses, normal sensory exam, and nail fungus has improved, polish present today, pain to palpation to third interspace on left not on right     Assessment:   1. Morton's neuroma of both feet      Plan:  Patient was evaluated and treated and all questions answered.    Her Morton's neuroma was symptomatic again on the left foot and not on the right.  Due to recent hyperglycemia it recommended sclerosing alcohol injection.  Discussed risk benefits of this.  Following consent and sterile prep with alcohol of the left third interspace from a dorsal approach was injected with 2 cc of dehydrated alcohol mixed with lidocaine  2% with epinephrine .  She tolerated this well.  Follow-up in 1 week for second injection.  No follow-ups on file.

## 2024-01-16 ENCOUNTER — Encounter: Payer: Self-pay | Admitting: Podiatry

## 2024-01-16 ENCOUNTER — Ambulatory Visit: Admitting: Podiatry

## 2024-01-16 VITALS — Ht 65.0 in | Wt 179.0 lb

## 2024-01-16 DIAGNOSIS — G5762 Lesion of plantar nerve, left lower limb: Secondary | ICD-10-CM

## 2024-01-20 NOTE — Progress Notes (Signed)
°  Subjective:  Patient ID: Paige Leon, female    DOB: May 18, 1975,  MRN: [id number]  Chief Complaint  Patient presents with   Neuroma    Rm 6 Patient is for an injection for morton's neuroma. Pt is here for 2nd injection in the left foot.    48 y.o. female presents with the above complaint. History confirmed with patient.  The left foot neuroma is painful again, A1c has been somewhat elevated and is here today for sclerosing alcohol injection.  Objective:  Physical Exam: warm, good capillary refill, no trophic changes or ulcerative lesions, normal DP and PT pulses, normal sensory exam, and nail fungus has improved, polish present today, pain to palpation to third interspace on left not on right     Assessment:   1. Morton's neuroma of left foot      Plan:  Patient was evaluated and treated and all questions answered.    She returns today for injection #2. Tolerated first well w/o complications.  Following verbal consent and sterile prep with alcohol of the left third interspace from a dorsal approach was injected with 2 cc of dehydrated alcohol mixed with lidocaine  2% with epinephrine.  She tolerated this well.  Follow-up in 1 week for third injection.  No follow-ups on file.

## 2024-01-24 ENCOUNTER — Ambulatory Visit: Admitting: Podiatry

## 2024-01-24 VITALS — Ht 65.0 in | Wt 179.0 lb

## 2024-01-24 DIAGNOSIS — G5762 Lesion of plantar nerve, left lower limb: Secondary | ICD-10-CM

## 2024-01-27 NOTE — Progress Notes (Signed)
"  °  Subjective:  Patient ID: Paige Leon, female    DOB: [birth year],  MRN: [id number]  Chief Complaint  Patient presents with   Injections    Rm 5 Patient is here for an injection.    48 y.o. female presents with the above complaint. History confirmed with patient.  The left foot neuroma is painful again, A1c has been somewhat elevated and is here today for sclerosing alcohol injection.  Objective:  Physical Exam: warm, good capillary refill, no trophic changes or ulcerative lesions, normal DP and PT pulses, normal sensory exam, and nail fungus has improved, polish present today, pain to palpation to third interspace on left not on right     Assessment:   1. Morton's neuroma of left foot      Plan:  Patient was evaluated and treated and all questions answered.    She returns today for injection #3. Tolerated first two well w/o complications although does have some soreness now.  Following verbal consent and sterile prep with alcohol of the left third interspace from a dorsal approach was injected with 2 cc of dehydrated alcohol mixed with lidocaine  2% with epinephrine.  She tolerated this well.   Follow-up in 3-4 weeks for additional injection we will take a break over the holidays to allow her tissues to rest.  So far has had some improvement she reports.  No follow-ups on file.    "

## 2024-02-19 ENCOUNTER — Ambulatory Visit: Admitting: Podiatry

## 2024-02-19 VITALS — Ht 65.0 in | Wt 179.0 lb

## 2024-02-19 DIAGNOSIS — G5762 Lesion of plantar nerve, left lower limb: Secondary | ICD-10-CM | POA: Diagnosis not present

## 2024-02-19 NOTE — Progress Notes (Signed)
"  °  Subjective:  Patient ID: Paige Leon, female    DOB: December 11, 1975,  MRN: [id number]  Chief Complaint  Patient presents with   Neuroma    RM 2 Patient is here to f/u on morton's neuroma of the left foot. Pt states pain is the same as previous visit. Pt is interested in a steroid injection.    49 y.o. female presents with the above complaint. History confirmed with patient.  The left foot neuroma is painful again,, overall feels like the alcohol injections have not progressed much.  Would like to try corticosteroid injection.  She notes that her blood sugar has remained well-controlled.  Objective:  Physical Exam: warm, good capillary refill, no trophic changes or ulcerative lesions, normal DP and PT pulses, normal sensory exam, and nail fungus has improved, polish present today, pain to palpation to third interspace on left not on right     Assessment:   1. Morton's neuroma of left foot      Plan:  Patient was evaluated and treated and all questions answered.  She returns today for follow-up for her Morton's neuroma of her left foot, had not had much relief after 3 injections of a sclerosing alcohol injection she wished proceed with corticosteroid injection today.  Following consent and prep with alcohol the left third interspace from a dorsal approach was injected the point maximal tenderness with 10 mg of Kenalog  4 mg dexamethasone  and 2.5 mg of Marcaine  0.5% plain.  Discussed if this does not alleviate for a few months then I would recommend an MRI that she will let me know how she is doing in 1 month to reevaluate. No follow-ups on file.    "
